# Patient Record
Sex: Male | Born: 1980 | Race: White | Hispanic: No | Marital: Married | Smoking: Former smoker
Health system: Southern US, Community
[De-identification: ages and names within clinical notes are randomized; demographics above are authoritative.]

## PROBLEM LIST (undated history)

## (undated) DIAGNOSIS — I10 Essential (primary) hypertension: Secondary | ICD-10-CM

## (undated) DIAGNOSIS — T7840XA Allergy, unspecified, initial encounter: Secondary | ICD-10-CM

## (undated) HISTORY — DX: Allergy, unspecified, initial encounter: T78.40XA

---

## 2018-01-20 ENCOUNTER — Ambulatory Visit (INDEPENDENT_AMBULATORY_CARE_PROVIDER_SITE_OTHER): Payer: 59 | Admitting: Family Medicine

## 2018-01-20 ENCOUNTER — Encounter: Payer: Self-pay | Admitting: Family Medicine

## 2018-01-20 VITALS — BP 135/87 | HR 69 | Ht 67.0 in | Wt 226.3 lb

## 2018-01-20 DIAGNOSIS — Z809 Family history of malignant neoplasm, unspecified: Secondary | ICD-10-CM

## 2018-01-20 DIAGNOSIS — Z0000 Encounter for general adult medical examination without abnormal findings: Secondary | ICD-10-CM | POA: Diagnosis not present

## 2018-01-20 DIAGNOSIS — Z6835 Body mass index (BMI) 35.0-35.9, adult: Secondary | ICD-10-CM | POA: Insufficient documentation

## 2018-01-20 DIAGNOSIS — Z23 Encounter for immunization: Secondary | ICD-10-CM | POA: Diagnosis not present

## 2018-01-20 DIAGNOSIS — Z87891 Personal history of nicotine dependence: Secondary | ICD-10-CM | POA: Diagnosis not present

## 2018-01-20 NOTE — Patient Instructions (Signed)
If you want to work on weight loss in the future I am here to help facilitate those health goals and go over your lose it app or my fitness pal app to try to obtain a BMI under 30.  Let me know how I can help     Please realize, EXERCISE IS MEDICINE!  -  American Heart Association [facility]) guidelines for exercise : If you are in good health, without any medical conditions, you should engage in 150 minutes of moderate intensity aerobic activity per week.  This means you should be huffing and puffing throughout your workout.   Engaging in regular exercise will improve brain function and memory, as well as improve mood, boost immune system and help with weight management.  As well as the other, more well-known effects of exercise such as decreasing blood sugar levels, decreasing blood pressure,  and decreasing bad cholesterol levels/ increasing good cholesterol levels.     -  The AHA strongly endorses consumption of a diet that contains a variety of foods from all the food categories with an emphasis on fruits and vegetables; fat-free and low-fat dairy products; cereal and grain products; legumes and nuts; and fish, poultry, and/or extra lean meats.    Excessive food intake, especially of foods high in saturated and trans fats, sugar, and salt, should be avoided.    Adequate water intake of roughly 1/2 of your weight in pounds, should equal the ounces of water per day you should drink.  So for instance, if you're 200 pounds, that would be 100 ounces of water per day.         Mediterranean Diet  Why follow it? Research shows. . Those who follow the Mediterranean diet have a reduced risk of heart disease  . The diet is associated with a reduced incidence of Parkinson's and Alzheimer's diseases . People following the diet may have longer life expectancies and lower rates of chronic diseases  . The Dietary Guidelines for Americans recommends the Mediterranean diet as an eating plan to promote health and  prevent disease  What Is the Mediterranean Diet?  . Healthy eating plan based on typical foods and recipes of Mediterranean-style cooking . The diet is primarily a plant based diet; these foods should make up a majority of meals   Starches - Plant based foods should make up a majority of meals - They are an important sources of vitamins, minerals, energy, antioxidants, and fiber - Choose whole grains, foods high in fiber and minimally processed items  - Typical grain sources include wheat, oats, barley, corn, brown rice, bulgar, farro, millet, polenta, couscous  - Various types of beans include chickpeas, lentils, fava beans, black beans, white beans   Fruits  Veggies - Large quantities of antioxidant rich fruits & veggies; 6 or more servings  - Vegetables can be eaten raw or lightly drizzled with oil and cooked  - Vegetables common to the traditional Mediterranean Diet include: artichokes, arugula, beets, broccoli, brussel sprouts, cabbage, carrots, celery, collard greens, cucumbers, eggplant, kale, leeks, lemons, lettuce, mushrooms, okra, onions, peas, peppers, potatoes, pumpkin, radishes, rutabaga, shallots, spinach, sweet potatoes, turnips, zucchini - Fruits common to the Mediterranean Diet include: apples, apricots, avocados, cherries, clementines, dates, figs, grapefruits, grapes, melons, nectarines, oranges, peaches, pears, pomegranates, strawberries, tangerines  Fats - Replace butter and margarine with healthy oils, such as olive oil, canola oil, and tahini  - Limit nuts to no more than a handful a day  - Nuts include walnuts, almonds,  pecans, pistachios, pine nuts  - Limit or avoid candied, honey roasted or heavily salted nuts - Olives are central to the Mediterranean diet - can be eaten whole or used in a variety of dishes   Meats Protein - Limiting red meat: no more than a few times a month - When eating red meat: choose lean cuts and keep the portion to the size of deck of cards -  Eggs: approx. 0 to 4 times a week  - Fish and lean poultry: at least 2 a week  - Healthy protein sources include, chicken, [place], lean beef, lamb - Increase intake of seafood such as tuna, salmon, trout, mackerel, shrimp, scallops - Avoid or limit high fat processed meats such as sausage and bacon  Dairy - Include moderate amounts of low fat dairy products  - Focus on healthy dairy such as fat free yogurt, skim milk, low or reduced fat cheese - Limit dairy products higher in fat such as whole or 2% milk, cheese, ice cream  Alcohol - Moderate amounts of red wine is ok  - No more than 5 oz daily for women (all ages) and men older than age 38  - No more than 10 oz of wine daily for men younger than 5965  Other - Limit sweets and other desserts  - Use herbs and spices instead of salt to flavor foods  - Herbs and spices common to the traditional Mediterranean Diet include: basil, bay leaves, chives, cloves, cumin, fennel, garlic, lavender, marjoram, mint, oregano, parsley, pepper, rosemary, sage, savory, sumac, tarragon, thyme   It's not just a diet, it's a lifestyle:  . The Mediterranean diet includes lifestyle factors typical of those in the region  . Foods, drinks and meals are best eaten with others and savored . Daily physical activity is important for overall good health . This could be strenuous exercise like running and aerobics . This could also be more leisurely activities such as walking, housework, yard-work, or taking the stairs . Moderation is the key; a balanced and healthy diet accommodates most foods and drinks . Consider portion sizes and frequency of consumption of certain foods   Meal Ideas & Options:  . Breakfast:  o Whole wheat toast or whole wheat English muffins with peanut butter & hard boiled egg o Steel cut oats topped with apples & cinnamon and skim milk  o Fresh fruit: banana, strawberries, melon, berries, peaches  o Smoothies: strawberries, bananas, greek yogurt,  peanut butter o Low fat greek yogurt with blueberries and granola  o Egg white omelet with spinach and mushrooms o Breakfast couscous: whole wheat couscous, apricots, skim milk, cranberries  . Sandwiches:  o Hummus and grilled vegetables (peppers, zucchini, squash) on whole wheat bread   o Grilled chicken on whole wheat pita with lettuce, tomatoes, cucumbers or tzatziki  o Tuna salad on whole wheat bread: tuna salad made with greek yogurt, olives, red peppers, capers, green onions o Garlic rosemary lamb pita: lamb sauted with garlic, rosemary, salt & pepper; add lettuce, cucumber, greek yogurt to pita - flavor with lemon juice and black pepper  . Seafood:  o Mediterranean grilled salmon, seasoned with garlic, basil, parsley, lemon juice and black pepper o Shrimp, lemon, and spinach whole-grain pasta salad made with low fat greek yogurt  o Seared scallops with lemon orzo  o Seared tuna steaks seasoned salt, pepper, coriander topped with tomato mixture of olives, tomatoes, olive oil, minced garlic, parsley, green onions and cappers  . Meats:  o Herbed greek chicken salad with kalamata olives, cucumber, feta  o Red bell peppers stuffed with spinach, bulgur, lean ground beef (or lentils) & topped with feta   o Kebabs: skewers of chicken, tomatoes, onions, zucchini, squash  o [place] burgers: made with red onions, mint, dill, lemon juice, feta cheese topped with roasted red peppers . Vegetarian o Cucumber salad: cucumbers, artichoke hearts, celery, red onion, feta cheese, tossed in olive oil & lemon juice  o Hummus and whole grain pita points with a greek salad (lettuce, tomato, feta, olives, cucumbers, red onion) o Lentil soup with celery, carrots made with vegetable broth, garlic, salt and pepper  o Tabouli salad: parsley, bulgur, mint, scallions, cucumbers, tomato, radishes, lemon juice, olive oil, salt and pepper.

## 2018-01-20 NOTE — Addendum Note (Signed)
Addended by: Leda MinPULLIAM, MELISSA D on: 01/20/2018 05:29 PM   Modules accepted: Orders

## 2018-01-20 NOTE — Progress Notes (Signed)
New patient office visit note: Impression and Recommendations:    1. Healthcare maintenance   2. History of social smoking for 19 years- 1 cig q 3-d   3. BMI 35.0-35.9,adult   4. Family history of cancer     Education and routine counseling performed. Handouts provided.  1. General Health & Weight Maintenance - Advised patient to continue working toward exercising to improve health.    - Patient may begin with 15 minutes of activity daily.  Recommended that the patient eventually strive for at least 150 minutes of cardio per week according to the [facility].   - American Heart Association guidelines for healthy diet, basically Mediterranean diet, and exercise guidelines of moderate exercise 30 minutes 5 days per week or more discussed in detail.  - Healthy dietary habits encouraged, including low-carb, and high amounts of lean protein in diet.   - Patient should also consume adequate amounts of water - half of body weight in oz of water per day  - Recommended tracking food intake seriously if interested in losing weight.    - Advised the patient that reducing his BMI from 35.44 kg/m. down to the 30-35 range will decrease his risk for disease. - Discussed that 190 may be realistic for him, and getting BMI below 30 is a good goal.  Explained to patient what BMI refers to, and what it means medically.    Told patient to think about it as a "medical risk stratification measurement" and how increasing BMI is associated with increasing risk/ or worsening state of various diseases such as hypertension, hyperlipidemia, diabetes, premature OA, depression etc.  Health counseling performed.  All questions answered.  - Discussed that we can meet with him regularly in the future to facilitate his weight loss goals.  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  2. Follow-Up - Patient agrees to return for his yearly physical & disease  screen, and for a yearly chronic health visit on topics that concern him.  - Fasting blood work will be obtained in the near future at his convenience. - Follow up OV should be scheduled to discuss his blood work.  - CPE in 4-6 months.   Gross side effects, risk and benefits, and alternatives of medications discussed with patient.  Patient is aware that all medications have potential side effects and we are unable to predict every side effect or drug-drug interaction that may occur.  Expresses verbal understanding and consents to current therapy plan and treatment regimen.  Return for FBW near future with f/up OV to discuss; then yrly P Exam.  Please see AVS handed out to patient at the end of our visit for further patient instructions/ counseling done pertaining to today's office visit.    Note: This document was prepared using Dragon voice recognition software and may include unintentional dictation errors.  This document serves as a record of services personally performed by Thomasene Lot, DO. It was created on her behalf by Peggye Fothergill, a trained medical scribe. The creation of this record is based on the scribe's personal observations and the provider's statements to them.   I have reviewed the above medical documentation for accuracy and completeness and I concur.  Thomasene Lot 01/20/18 5:19 PM    ----------------------------------------------------------------------------------------------------------------------    Subjective:    Chief complaint:   Chief Complaint  Patient presents with  . Establish Care    HPI: Henry Miranda is  a pleasant 37 y.o. male who [facility] today to review their medical history with me and establish care.   I asked the patient to review their chronic problem list with me to ensure everything was updated and accurate.    All recent office visits with other providers, any medical records  that patient brought in etc  - I reviewed today.     We asked pt to get us their medical records from [facility] providers/ specialists that they had seen within the past 3-5 years- if they are in private practice and/or do not work for [facility], [facility], [facility], [facility] or [facility] owned practice.  Told them to call their specialists to clarify this if they are not sure.   Here to establish at the clinic along with the rest of his family.  Social History Wife Misty; 3 kids.  All seen here at clinic. 2 daughters, aged 248 and 679.  1 son aged 38.  [place] here this past summer for work.  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 [place] - travels a [place]. 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 Likes the company he works for, [employer].  One of four siblings Youngest of three brothers, and one little sister who lives in [place].  Dad passed when he was 37 years old.  - Tobacco Use Smoked socially for 19 years, off and on. "Was not habitual."  Started at age 89, quit ~20 years later. On average, smoked about one cigarette every 2-3 days for 19 years.  Eats fruits and vegetables, a lot of nuts. Feels that he eats "fairly well."  Was an athlete when he was younger. Patient notes that he weighed 155 as a sophomore in high school. Prefers to weigh about 175 lbs, feels that 190 lbs is a realistic goal.  Used to ride a bicycle to work for 3 years, and still couldn't lose the weight.  Uses MyFitnessPal app and also has LoseIt app.  Family History Father had brain tumor - some kind of brain cancer. Father was age 38 at death.  Grandparents (father's parents) smoked 2 ppd each.  Mom had high blood pressure. Great uncle died of pancreatic cancer in his  late 4650's. Great great grandmother died of cancer, "ate her alive literally."  Notes that most of his family is very thin.  Grandmother has sclera derma and has been on chemo for 10 years.  Past Medical History No history of high blood pressure. No cholesterol problems. Never been told he was prediabetic. Never has had allergies.  Last blood work was drawn when he got married.  Chronic muscle/elbow pain from holding his computer in the same position. Notes that he's stuck in a car half the time, holding his laptop constantly.  Notes "I don't go to the doctor much."   Wt Readings from Last 3 Encounters:  01/20/18 226 lb 4.8 oz (102.6 kg)   BP Readings from Last 3 Encounters:  01/20/18 (!) 145/90   Pulse Readings from Last 3 Encounters:  01/20/18 69   BMI Readings from Last 3 Encounters:  01/20/18 35.44 kg/m    Patient Care Team    Relationship Specialty Notifications Start End  Thomasene Lotpalski, Marv Alfrey, DO PCP - General Family Medicine  12/09/17  Patient Active Problem List   Diagnosis Date Noted  . History of social smoking for 19 years- 1 cig q 3-d 01/20/2018  . BMI 35.0-35.9,adult 01/20/2018    Family History  Problem Relation Age of Onset  . Hypertension Mother   . Cancer Father        brain     Social History   Substance and Sexual Activity  Drug Use No     Social History   Substance and Sexual Activity  Alcohol Use Yes  . Alcohol/week: 1.2 - 1.8 oz  . Types: 2 - 3 Standard drinks or equivalent per week     Social History   Tobacco Use  Smoking Status Former Smoker  . Packs/day: 0.50  . Years: 10.00  . Pack years: 5.00  . Types: Cigarettes  . Last attempt to quit: 2000  . Years since quitting: 19.1  Smokeless Tobacco Never Used     No outpatient medications have been marked as taking for the 01/20/18 encounter (Office Visit) with Thomasene Lot, DO.    Allergies: Patient has no allergy information on record.   Review of Systems    Constitutional: Negative for chills, diaphoresis, fever, malaise/fatigue and weight loss.  HENT: Negative for congestion, sore throat and tinnitus.   Eyes: Negative for blurred vision, double vision and photophobia.  Respiratory: Negative for cough and wheezing.   Cardiovascular: Negative for chest pain and palpitations.  Gastrointestinal: Negative for blood in stool, diarrhea, nausea and vomiting.  Genitourinary: Negative for dysuria, frequency and urgency.  Musculoskeletal: Negative for joint pain and myalgias.  Skin: Negative for itching and rash.  Neurological: Negative for dizziness, focal weakness, weakness and headaches.  Endo/Heme/Allergies: Negative for environmental allergies and polydipsia. Does not bruise/bleed easily.  Psychiatric/Behavioral: Negative for depression and memory loss. The patient is not nervous/anxious and does not have insomnia.      Objective:   Blood pressure (!) 145/90, pulse 69, height 5\' 7"  (1.702 m), weight 226 lb 4.8 oz (102.6 kg), SpO2 99 %. Body mass index is 35.44 kg/m. General: Well Developed, well nourished, and in no acute distress.  Neuro: Alert and oriented x3, extra-ocular muscles intact, sensation grossly intact.  HEENT:[place]/AT, PERRLA, neck supple, No carotid bruits Skin: no gross rashes  Cardiac: Regular rate and rhythm Respiratory: Essentially clear to auscultation bilaterally. Not using accessory muscles, speaking in full sentences.  Abdominal: not grossly distended Musculoskeletal: Ambulates w/o diff, FROM * 4 ext.  Vasc: less 2 sec cap RF, warm and pink  Psych:  No HI/SI, judgement and insight good, Euthymic mood. Full Affect.

## 2019-07-13 ENCOUNTER — Ambulatory Visit (INDEPENDENT_AMBULATORY_CARE_PROVIDER_SITE_OTHER): Payer: 59 | Admitting: Family Medicine

## 2019-07-13 ENCOUNTER — Other Ambulatory Visit: Payer: Self-pay

## 2019-07-13 ENCOUNTER — Encounter: Payer: Self-pay | Admitting: Family Medicine

## 2019-07-13 ENCOUNTER — Other Ambulatory Visit: Payer: 59

## 2019-07-13 VITALS — BP 137/90 | HR 62 | Ht 66.5 in | Wt 190.0 lb

## 2019-07-13 DIAGNOSIS — Z23 Encounter for immunization: Secondary | ICD-10-CM | POA: Diagnosis not present

## 2019-07-13 DIAGNOSIS — L814 Other melanin hyperpigmentation: Secondary | ICD-10-CM | POA: Insufficient documentation

## 2019-07-13 DIAGNOSIS — Z0000 Encounter for general adult medical examination without abnormal findings: Secondary | ICD-10-CM

## 2019-07-13 DIAGNOSIS — B354 Tinea corporis: Secondary | ICD-10-CM | POA: Insufficient documentation

## 2019-07-13 DIAGNOSIS — Z719 Counseling, unspecified: Secondary | ICD-10-CM | POA: Diagnosis not present

## 2019-07-13 DIAGNOSIS — L819 Disorder of pigmentation, unspecified: Secondary | ICD-10-CM

## 2019-07-13 DIAGNOSIS — N509 Disorder of male genital organs, unspecified: Secondary | ICD-10-CM

## 2019-07-13 NOTE — Progress Notes (Signed)
Male physical  Impression and Recommendations:    1. Encounter for wellness examination   2. Health education/counseling   3. Need for Tdap vaccination   4. Ringworm of body   5. 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 Nevoid hypermelanosis     1) Anticipatory Guidance: Discussed importance of wearing a seatbelt while driving, not texting while driving;   sunscreen when outside along with skin surveillance; eating a balanced and modest diet; physical activity at least 25 minutes per day or 150 min/ week moderate to intense activity.  - Patient knows to keep an eye on his blood pressure at home.  - Prudent self-testicular exam screening habits reviewed with patient today.  Ringworm of Body - To help prevent fungal skin concerns, advised patient to remove damp clothing ASAP. - Will continue to monitor.  Nevoid hypermelanosis - Prudent skin screening habits reviewed and discussed with patient today. - Patient knows to keep an eye on skin when concerning sx appear.   2) Immunizations / Screenings / Labs:  All immunizations are up-to-date per recommendations or will be updated today. Patient is due for dental and vision screens which pt will schedule independently. Will obtain CBC, CMP, HgA1c, Lipid panel, TSH and vit D when fasting, if not already done recently.   - Blood work obtained this morning.  - Need for TDAP.  Education provided to patient today.  - Discussed need for once-lifetime Hep C / HIV screen.  - Patient understands need for screenings as recommended.   3) Weight - Body mass index is 30.21 kg/m BMI meaning discussed with patient.  Discussed goal of losing 5-10% of current body weight which would improve overall feelings of well being and improve objective health data. Improve nutrient density of diet through increasing intake of fruits and vegetables and decreasing saturated fats, white flour products and refined sugars.    Told patient to think about BMI as a "medical risk stratification measurement" and how increasing BMI is associated with increasing risk/ or worsening state of various diseases such as hypertension, hyperlipidemia, diabetes, premature OA, depression etc.  American Heart Association guidelines for healthy diet, basically Mediterranean diet, and exercise guidelines of 30 minutes 5 days per week or more discussed in detail.  - Encouraged patient to continue with his weight loss goals. - Advised patient of importance of reducing BMI below the 30 range.  Health counseling performed.  All questions answered.   4) Lifestyle & Preventative Health Maintenance - Advised patient to continue working toward exercising and physical conditioning to improve overall mental, physical, and emotional health.    - Reviewed the "spokes of the wheel" of mood and health management.  Stressed the importance of ongoing prudent habits, including regular exercise, appropriate sleep hygiene, healthful dietary habits, and prayer/meditation to relax.  - Encouraged patient to engage in daily physical activity, especially a formal exercise routine.  Recommended that the patient eventually strive for at least 150 minutes of moderate cardiovascular activity per week according to guidelines established by the [facility].   - Healthy dietary habits encouraged, including low-carb, and high amounts of lean protein in diet.   - Patient should also consume adequate amounts of water.   Orders Placed This Encounter  Procedures   Tdap vaccine greater than or equal to 7yo IM    Gross side effects, risk and benefits, and alternatives of medications discussed with patient.  Patient is aware that all medications have  potential side effects and we are unable to predict every side effect or drug-drug interaction that may occur.  Expresses verbal understanding and consents to current therapy plan and treatment regimen.  Please see AVS  handed out to patient at the end of our visit for further patient instructions/ counseling done pertaining to today's office visit.  Follow-up preventative CPE in 1 year. Follow-up office visit pending lab work.  F/up sooner for chronic care management and/or prn  This document serves as a record of services personally performed by Thomasene Loteborah Kartel Wolbert, DO. It was created on her behalf by Peggye FothergillKatherine Galloway, a trained medical scribe. The creation of this record is based on the scribe's personal observations and the provider's statements to them.   I have reviewed the above medical documentation for accuracy and completeness and I concur.  Thomasene Loteborah Kelvin Burpee, DO 07/17/2019 11:55 PM        Subjective:    CC: CPE  HPI: Ceasar LundJeffrey Stonebraker is a 38 y.o. male who [facility] yearly health maintenance exam.     Health Maintenance Summary Reviewed and updated, unless pt declines services.  Tobacco History Reviewed:  Former smoker. Per patient, smoked socially 19 years, 1 cigarette every 3 days; ~5 pack-years. Alcohol:  Has one or two drinks per week, typically a beer or a whisky.   States "it depends what I have at the house." Exercise Habits:  States he started playing golf and baseball about a month ago. Walks three days per week, plays golf for 1.5-3 hours at a time. Also goes to the driving range and walks his dog for 45 min "most days." STD concerns:  Monogamous with wife, no concerns. Drug Use:  None Birth control method:  None reported. Testicular/penile concerns:  Denies concerns; "everything functions the way it's supposed to."  Patient has had the mass on his left testicle for as long as he can recall, and has always been told he has this little abnormality at the base of his testicle.  Denies depression or anxiety.  Denies GI concerns such as vomiting, diarrhea, constipation, loose stools.  States family history of pancreatic cancer,  2nd degree relative with skin cancer, 2nd or 3rd degree relative with a cancer that "ate them from the inside out." His great uncle Elijah Birkom had pancreatic cancer.  No known history of prostate cancer.  Denies urinary concerns such as problems with stream, getting up frequently at night.  Recent Weight Loss - 36 lbs Weight was 226 lbs on last visit, which was 01/20/2018. Patient is now down to 190 lbs.  [facility]. States he got up to 234 lbs and decided to begin [employer]. His goal is 175 lbs on Weight Watchers. Realistically, he would like to try to get to 155 lbs.  Visual Health Obtains eye exams yearly.  Dental Health Returns to the dentist every six months. Isn't sure what his dentist's name is.  Dermatological Health Confirms wearing sunscreen. Had a recent run-in with ringworm and has been using "stuff he had at home" for this.      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 No pertinent past medical history.  History reviewed. No pertinent surgical history.  Family History  Problem Relation Age of Onset    Hypertension Mother    Cancer Father        brain    Social History   Substance and Sexual Activity  Drug Use No  ,  Social History   Substance and Sexual Activity  Alcohol Use Yes   Alcohol/week: 2.0 - 3.0 standard drinks   Types: 2 - 3 Standard drinks or equivalent per week  ,  Social History   Tobacco Use  Smoking Status Former Smoker   Packs/day: 0.50   Years: 10.00   Pack years: 5.00   Types: Cigarettes   Quit date: 2000   Years since quitting: 20.6  Smokeless Tobacco Never Used  ,  Social History   Substance and Sexual Activity  Sexual Activity Yes   Birth control/protection: None    Patient's Medications  New Prescriptions   PREDNISONE (STERAPRED UNI-PAK 21 TAB) 10 MG (21) TBPK TABLET    6 tabs for 1 day, then 5 tabs for 1 das, then 4 tabs for 1 day, then 3 tabs for 1 day, 2 tabs for 1 day, then 1 tab for 1 day  Previous Medications   No medications on file  Modified Medications   No medications on file  Discontinued Medications   No medications on file    Patient has no known allergies.  Review of Systems: General:   Denies fever, chills, unexplained weight loss.  Optho/Auditory:   Denies visual changes, blurred vision/LOV Respiratory:   Denies SOB, DOE more than baseline levels.  Cardiovascular:   Denies chest pain, palpitations, new onset peripheral edema  Gastrointestinal:   Denies nausea, vomiting, diarrhea.  Genitourinary: Denies dysuria, freq/ urgency, flank pain or discharge from genitals.  Endocrine:     Denies hot or cold intolerance, polyuria, polydipsia. Musculoskeletal:   Denies unexplained myalgias, joint swelling, unexplained arthralgias, gait problems.  Skin:  Denies rash, suspicious lesions Neurological:     Denies dizziness, unexplained weakness, numbness  Psychiatric/Behavioral:   Denies mood changes, suicidal or homicidal ideations, hallucinations    Objective:     Blood pressure 137/90, pulse 62, height 5' 6.5"  (1.689 m), weight 190 lb (86.2 kg), SpO2 98 %. Body mass index is 30.21 kg/m. General Appearance:    Alert, cooperative, no distress, appears stated age  Head:    Normocephalic, without obvious abnormality, atraumatic  Eyes:    PERRL, conjunctiva/corneas clear, EOM's intact, fundi    benign, both eyes  Ears:    Normal TM's and external ear canals, both ears  Nose:   Nares normal, septum midline, mucosa normal, no drainage    or sinus tenderness  Throat:   Lips w/o lesion, mucosa moist, and tongue normal; teeth and   gums normal  Neck:   Supple, symmetrical,  trachea midline, no adenopathy;    thyroid:  no enlargement/tenderness/nodules; no carotid   bruit or JVD  Back:     Symmetric, no curvature, ROM normal, no CVA tenderness  Lungs:     Clear to auscultation bilaterally, respirations unlabored, no       Wh/ R/ R  Chest Wall:    No tenderness or gross deformity; normal excursion   Heart:    Regular rate and rhythm, S1 and S2 normal, no murmur, rub   or gallop  Abdomen:     Soft, non-tender, bowel sounds active all four quadrants, NO   G/R/R, no masses, no organomegaly  Genitalia:    Ext genitalia: without lesion, no penile rash or discharge, no hernias appreciated.  Base of left testicle with 1.5-2 cm soft tissue mass at base of teste that is nontender and freely mobile ( per pt it has been there forever).  Rectal:    Deferred to age 38.  Extremities:   Extremities normal, atraumatic, no cyanosis or gross edema  Pulses:   2+ and symmetric all extremities  Skin:   Warm, dry, Skin color, texture, turgor normal.  Two hypo melanotic skin patches consistent with ringworm on abdomen.  Also, 2 mm by 1 mm hypermelanotic skin lesion upper abdomen, 2 inches above umbilicus.  M-Sk:   Ambulates * 4 w/o difficulty, no gross deformities, tone WNL  Neurologic:   CNII-XII intact, normal strength, sensation and reflexes    Throughout Psych:  No HI/SI, judgement and insight good, Euthymic mood. Full  Affect.

## 2019-07-13 NOTE — Patient Instructions (Signed)
Preventive Care for Adults, Male A healthy lifestyle and preventive care can promote health and wellness. Preventive health guidelines for men include the following key practices:  A routine yearly physical is a good way to check with your health care provider about your health and preventative screening. It is a chance to share any concerns and updates on your health and to receive a thorough exam.  Visit your dentist for a routine exam and preventative care every 6 months. Brush your teeth twice a day and floss once a day. Good oral hygiene prevents tooth decay and gum disease.  The frequency of eye exams is based on your age, health, family medical history, use of contact lenses, and other factors. Follow your health care provider's recommendations for frequency of eye exams.  Eat a healthy diet. Foods such as vegetables, fruits, whole grains, low-fat dairy products, and lean protein foods contain the nutrients you need without too many calories. Decrease your intake of foods high in solid fats, added sugars, and salt. Eat the right amount of calories for you.Get information about a proper diet from your health care provider, if necessary.  Regular physical exercise is one of the most important things you can do for your health. Most adults should get at least 150 minutes of moderate-intensity exercise (any activity that increases your heart rate and causes you to sweat) each week. In addition, most adults need muscle-strengthening exercises on 2 or more days a week.  Maintain a healthy weight. The body mass index (BMI) is a screening tool to identify possible weight problems. It provides an estimate of body fat based on height and weight. Your health care provider can find your BMI and can help you achieve or maintain a healthy weight.For adults 20 years and older:  A BMI below 18.5 is considered underweight.  A BMI of 18.5 to 24.9 is normal.  A BMI of 25 to 29.9 is considered  overweight.  A BMI of 30 and above is considered obese.  Maintain normal blood lipids and cholesterol levels by exercising and minimizing your intake of saturated fat. Eat a balanced diet with plenty of fruit and vegetables. Blood tests for lipids and cholesterol should begin at age 20 and be repeated every 5 years. If your lipid or cholesterol levels are high, you are over 50, or you are at high risk for heart disease, you may need your cholesterol levels checked more frequently.Ongoing high lipid and cholesterol levels should be treated with medicines if diet and exercise are not working.  If you smoke, find out from your health care provider how to quit. If you do not use tobacco, do not start.  Lung cancer screening is recommended for adults aged 55-80 years who are at high risk for developing lung cancer because of a history of smoking. A yearly low-dose CT scan of the lungs is recommended for people who have at least a 30-pack-year history of smoking and are a current smoker or have quit within the past 15 years. A pack year of smoking is smoking an average of 1 pack of cigarettes a day for 1 year (for example: 1 pack a day for 30 years or 2 packs a day for 15 years). Yearly screening should continue until the smoker has stopped smoking for at least 15 years. Yearly screening should be stopped for people who develop a health problem that would prevent them from having lung cancer treatment.  If you choose to drink alcohol, do not have more   than 2 drinks per day. One drink is considered to be 12 ounces (355 mL) of beer, 5 ounces (148 mL) of wine, or 1.5 ounces (44 mL) of liquor.  Avoid use of street drugs. Do not share needles with anyone. Ask for help if you need support or instructions about stopping the use of drugs.  High blood pressure causes heart disease and increases the risk of stroke. Your blood pressure should be checked at least every 1-2 years. Ongoing high blood pressure should be  treated with medicines, if weight loss and exercise are not effective.  If you are 45-79 years old, ask your health care provider if you should take aspirin to prevent heart disease.  Diabetes screening is done by taking a blood sample to check your blood glucose level after you have not eaten for a certain period of time (fasting). If you are not overweight and you do not have risk factors for diabetes, you should be screened once every 3 years starting at age 45. If you are overweight or obese and you are 40-70 years of age, you should be screened for diabetes every year as part of your cardiovascular risk assessment.  Colorectal cancer can be detected and often prevented. Most routine colorectal cancer screening begins at the age of 50 and continues through age 75. However, your health care provider may recommend screening at an earlier age if you have risk factors for colon cancer. On a yearly basis, your health care provider may provide home test kits to check for hidden blood in the stool. Use of a small camera at the end of a tube to directly examine the colon (sigmoidoscopy or colonoscopy) can detect the earliest forms of colorectal cancer. Talk to your health care provider about this at age 50, when routine screening begins. Direct exam of the colon should be repeated every 5-10 years through age 75, unless early forms of precancerous polyps or small growths are found.  People who are at an increased risk for hepatitis B should be screened for this virus. You are considered at high risk for hepatitis B if:  You were born in a country where hepatitis B occurs often. Talk with your health care provider about which countries are considered high risk.  Your parents were born in a high-risk country and you have not received a shot to protect against hepatitis B (hepatitis B vaccine).  You have HIV or AIDS.  You use needles to inject street drugs.  You live with, or have sex with, someone who  has hepatitis B.  You are a man who has sex with other men (MSM).  You get hemodialysis treatment.  You take certain medicines for conditions such as cancer, organ transplantation, and autoimmune conditions.  Hepatitis C blood testing is recommended for all people born from 1945 through 1965 and any individual with known risks for hepatitis C.  Practice safe sex. Use condoms and avoid high-risk sexual practices to reduce the spread of sexually transmitted infections (STIs). STIs include gonorrhea, chlamydia, syphilis, trichomonas, herpes, HPV, and human immunodeficiency virus (HIV). Herpes, HIV, and HPV are viral illnesses that have no cure. They can result in disability, cancer, and death.  If you are a man who has sex with other men, you should be screened at least once per year for:  HIV.  Urethral, rectal, and pharyngeal infection of gonorrhea, chlamydia, or both.  If you are at risk of being infected with HIV, it is recommended that you take a   that you take a prescription medicine daily to prevent HIV infection. This is called preexposure prophylaxis (PrEP). You are considered at risk if:  You are a man who has sex with other men (MSM) and have other risk factors.  You are a heterosexual man, are sexually active, and are at increased risk for HIV infection.  You take drugs by injection.  You are sexually active with a partner who has HIV.  Talk with your health care provider about whether you are at high risk of being infected with HIV. If you choose to begin PrEP, you should first be tested for HIV. You should then be tested every 3 months for as long as you are taking PrEP.  A one-time screening for abdominal aortic aneurysm (AAA) and surgical repair of large AAAs by ultrasound are recommended for men ages 24 to 16 years who are current or former smokers.  Healthy men should no longer receive prostate-specific antigen (PSA) blood tests as part of routine cancer screening. Talk with your health  care provider about prostate cancer screening.  Testicular cancer screening is not recommended for adult males who have no symptoms. Screening includes self-exam, a health care provider exam, and other screening tests. Consult with your health care provider about any symptoms you have or any concerns you have about testicular cancer.  Use sunscreen. Apply sunscreen liberally and repeatedly throughout the day. You should seek shade when your shadow is shorter than you. Protect yourself by wearing long sleeves, pants, a wide-brimmed hat, and sunglasses year round, whenever you are outdoors.  Once a month, do a whole-body skin exam, using a mirror to look at the skin on your back. Tell your health care provider about new moles, moles that have irregular borders, moles that are larger than a pencil eraser, or moles that have changed in shape or color.  Stay current with required vaccines (immunizations).  Influenza vaccine. All adults should be immunized every year.  Tetanus, diphtheria, and acellular pertussis (Td, Tdap) vaccine. An adult who has not previously received Tdap or who does not know his vaccine status should receive 1 dose of Tdap. This initial dose should be followed by tetanus and diphtheria toxoids (Td) booster doses every 10 years. Adults with an unknown or incomplete history of completing a 3-dose immunization series with Td-containing vaccines should begin or complete a primary immunization series including a Tdap dose. Adults should receive a Td booster every 10 years.  Varicella vaccine. An adult without evidence of immunity to varicella should receive 2 doses or a second dose if he has previously received 1 dose.  Human papillomavirus (HPV) vaccine. Males aged 11-21 years who have not received the vaccine previously should receive the 3-dose series. Males aged 22-26 years may be immunized. Immunization is recommended through the age of 20 years for any male who has sex with males  and did not get any or all doses earlier. Immunization is recommended for any person with an immunocompromised condition through the age of 23 years if he did not get any or all doses earlier. During the 3-dose series, the second dose should be obtained 4-8 weeks after the first dose. The third dose should be obtained 24 weeks after the first dose and 16 weeks after the second dose.  Zoster vaccine. One dose is recommended for adults aged 74 years or older unless certain conditions are present.  Measles, mumps, and rubella (MMR) vaccine. Adults born before 80 generally are considered immune to measles and mumps.  or later should have 1 or more doses of MMR vaccine unless there is a contraindication to the vaccine or there is laboratory evidence of immunity to each of the three diseases. A routine second dose of MMR vaccine should be obtained at least 28 days after the first dose for students attending postsecondary schools, health care workers, or international travelers. People who received inactivated measles vaccine or an unknown type of measles vaccine during 1963-1967 should receive 2 doses of MMR vaccine. People who received inactivated mumps vaccine or an unknown type of mumps vaccine before 1979 and are at high risk for mumps infection should consider immunization with 2 doses of MMR vaccine. Unvaccinated health care workers born before 1957 who lack laboratory evidence of measles, mumps, or rubella immunity or laboratory confirmation of disease should consider measles and mumps immunization with 2 doses of MMR vaccine or rubella immunization with 1 dose of MMR vaccine.  Pneumococcal 13-valent conjugate (PCV13) vaccine. When indicated, a person who is uncertain of his immunization history and has no record of immunization should receive the PCV13 vaccine. All adults 65 years of age and older should receive this vaccine. An adult aged 19 years or older who has certain medical  conditions and has not been previously immunized should receive 1 dose of PCV13 vaccine. This PCV13 should be followed with a dose of pneumococcal polysaccharide (PPSV23) vaccine. Adults who are at high risk for pneumococcal disease should obtain the PPSV23 vaccine at least 8 weeks after the dose of PCV13 vaccine. Adults older than 39 years of age who have normal immune system function should obtain the PPSV23 vaccine dose at least 1 year after the dose of PCV13 vaccine.  Pneumococcal polysaccharide (PPSV23) vaccine. When PCV13 is also indicated, PCV13 should be obtained first. All adults aged 65 years and older should be immunized. An adult younger than age 65 years who has certain medical conditions should be immunized. Any person who resides in a nursing home or long-term care facility should be immunized. An adult smoker should be immunized. People with an immunocompromised condition and certain other conditions should receive both PCV13 and PPSV23 vaccines. People with human immunodeficiency virus (HIV) infection should be immunized as soon as possible after diagnosis. Immunization during chemotherapy or radiation therapy should be avoided. Routine use of PPSV23 vaccine is not recommended for American Indians, [place] Natives, or people younger than 65 years unless there are medical conditions that require PPSV23 vaccine. When indicated, people who have unknown immunization and have no record of immunization should receive PPSV23 vaccine. One-time revaccination 5 years after the first dose of PPSV23 is recommended for people aged 19-64 years who have chronic kidney failure, nephrotic syndrome, asplenia, or immunocompromised conditions. People who received 1-2 doses of PPSV23 before age 65 years should receive another dose of PPSV23 vaccine at age 65 years or later if at least 5 years have passed since the previous dose. Doses of PPSV23 are not needed for people immunized with PPSV23 at or after age 65  years.  Meningococcal vaccine. Adults with asplenia or persistent complement component deficiencies should receive 2 doses of quadrivalent meningococcal conjugate (MenACWY-D) vaccine. The doses should be obtained at least 2 months apart. Microbiologists working with certain meningococcal bacteria, military recruits, people at risk during an outbreak, and people who travel to or live in countries with a high rate of meningitis should be immunized. A first-year college student up through age 21 years who is living in a residence hall should receive a   hall should receive a dose if he did not receive a dose on or after his 16th birthday. Adults who have certain high-risk conditions should receive one or more doses of vaccine.  Hepatitis A vaccine. Adults who wish to be protected from this disease, have chronic liver disease, work with hepatitis A-infected animals, work in hepatitis A research labs, or travel to or work in countries with a high rate of hepatitis A should be immunized. Adults who were previously unvaccinated and who anticipate close contact with an international adoptee during the first 60 days after arrival in the [place] from a country with a high rate of hepatitis A should be immunized.  Hepatitis B vaccine. Adults should be immunized if they wish to be protected from this disease, are under age 16 years and have diabetes, have chronic liver disease, have had more than one sex partner in the past 6 months, may be exposed to blood or other infectious body fluids, are household contacts or sex partners of hepatitis B positive people, are clients or workers in certain care facilities, or travel to or work in countries with a high rate of hepatitis B.  Haemophilus influenzae type b (Hib) vaccine. A previously unvaccinated person with asplenia or sickle cell disease or having a scheduled splenectomy should receive 1 dose of Hib vaccine. Regardless of previous immunization, a recipient of a hematopoietic stem cell  transplant should receive a 3-dose series 6-12 months after his successful transplant. Hib vaccine is not recommended for adults with HIV infection. Preventive Service / Frequency Ages 21 to 27  Blood pressure check.** / Every 3-5 years.  Lipid and cholesterol check.** / Every 5 years beginning at age 57.  Hepatitis C blood test.** / For any individual with known risks for hepatitis C.  Skin self-exam. / Monthly.  Influenza vaccine. / Every year.  Tetanus, diphtheria, and acellular pertussis (Tdap, Td) vaccine.** / Consult your health care provider. 1 dose of Td every 10 years.  Varicella vaccine.** / Consult your health care provider.  HPV vaccine. / 3 doses over 6 months, if 34 or younger.  Measles, mumps, rubella (MMR) vaccine.** / You need at least 1 dose of MMR if you were born in 1957 or later. You may also need a second dose.  Pneumococcal 13-valent conjugate (PCV13) vaccine.** / Consult your health care provider.  Pneumococcal polysaccharide (PPSV23) vaccine.** / 1 to 2 doses if you smoke cigarettes or if you have certain conditions.  Meningococcal vaccine.** / 1 dose if you are age 36 to 56 years and a Market researcher living in a residence hall, or have one of several medical conditions. You may also need additional booster doses.  Hepatitis A vaccine.** / Consult your health care provider.  Hepatitis B vaccine.** / Consult your health care provider.  Haemophilus influenzae type b (Hib) vaccine.** / Consult your health care provider. Ages 70 to 14  Blood pressure check.** / Every year.  Lipid and cholesterol check.** / Every 5 years beginning at age 60.  Lung cancer screening. / Every year if you are aged 12-80 years and have a 30-pack-year history of smoking and currently smoke or have quit within the past 15 years. Yearly screening is stopped once you have quit smoking for at least 15 years or develop a health problem that would prevent you from having  lung cancer treatment.  Fecal occult blood test (FOBT) of stool. / Every year beginning at age 37 and continuing until age 81. You may  this test if you get a colonoscopy every 10 years.  Flexible sigmoidoscopy** or colonoscopy.** / Every 5 years for a flexible sigmoidoscopy or every 10 years for a colonoscopy beginning at age 50 and continuing until age 75.  Hepatitis C blood test.** / For all people born from 1945 through 1965 and any individual with known risks for hepatitis C.  Skin self-exam. / Monthly.  Influenza vaccine. / Every year.  Tetanus, diphtheria, and acellular pertussis (Tdap/Td) vaccine.** / Consult your health care provider. 1 dose of Td every 10 years.  Varicella vaccine.** / Consult your health care provider.  Zoster vaccine.** / 1 dose for adults aged 60 years or older.  Measles, mumps, rubella (MMR) vaccine.** / You need at least 1 dose of MMR if you were born in 1957 or later. You may also need a second dose.  Pneumococcal 13-valent conjugate (PCV13) vaccine.** / Consult your health care provider.  Pneumococcal polysaccharide (PPSV23) vaccine.** / 1 to 2 doses if you smoke cigarettes or if you have certain conditions.  Meningococcal vaccine.** / Consult your health care provider.  Hepatitis A vaccine.** / Consult your health care provider.  Hepatitis B vaccine.** / Consult your health care provider.  Haemophilus influenzae type b (Hib) vaccine.** / Consult your health care provider. Ages 65 and over  Blood pressure check.** / Every year.  Lipid and cholesterol check.**/ Every 5 years beginning at age 20.  Lung cancer screening. / Every year if you are aged 55-80 years and have a 30-pack-year history of smoking and currently smoke or have quit within the past 15 years. Yearly screening is stopped once you have quit smoking for at least 15 years or develop a health problem that would prevent you from having lung cancer treatment.  Fecal  occult blood test (FOBT) of stool. / Every year beginning at age 50 and continuing until age 75. You may not have to do this test if you get a colonoscopy every 10 years.  Flexible sigmoidoscopy** or colonoscopy.** / Every 5 years for a flexible sigmoidoscopy or every 10 years for a colonoscopy beginning at age 50 and continuing until age 75.  Hepatitis C blood test.** / For all people born from 1945 through 1965 and any individual with known risks for hepatitis C.  Abdominal aortic aneurysm (AAA) screening.** / A one-time screening for ages 65 to 75 years who are current or former smokers.  Skin self-exam. / Monthly.  Influenza vaccine. / Every year.  Tetanus, diphtheria, and acellular pertussis (Tdap/Td) vaccine.** / 1 dose of Td every 10 years.  Varicella vaccine.** / Consult your health care provider.  Zoster vaccine.** / 1 dose for adults aged 60 years or older.  Pneumococcal 13-valent conjugate (PCV13) vaccine.** / 1 dose for all adults aged 65 years and older.  Pneumococcal polysaccharide (PPSV23) vaccine.** / 1 dose for all adults aged 65 years and older.  Meningococcal vaccine.** / Consult your health care provider.  Hepatitis A vaccine.** / Consult your health care provider.  Hepatitis B vaccine.** / Consult your health care provider.  Haemophilus influenzae type b (Hib) vaccine.** / Consult your health care provider. **Family history and personal history of risk and conditions may change your health care provider's recommendations.   This information is not intended to replace advice given to you by your health care provider. Make sure you discuss any questions you have with your health care provider.   Document Released: 12/31/2001 Document Revised: 11/25/2014 Document Reviewed: 04/01/2011 Elsevier Interactive Patient Education 2016   Interactive Patient Education [employer].

## 2019-07-14 LAB — COMPREHENSIVE METABOLIC PANEL
ALT: 21 IU/L (ref 0–44)
AST: 18 IU/L (ref 0–40)
Albumin/Globulin Ratio: 2.4 — ABNORMAL HIGH (ref 1.2–2.2)
Albumin: 4.6 g/dL (ref 4.0–5.0)
Alkaline Phosphatase: 72 IU/L (ref 39–117)
BUN/Creatinine Ratio: 16 (ref 9–20)
BUN: 14 mg/dL (ref 6–20)
Bilirubin Total: 1.1 mg/dL (ref 0.0–1.2)
CO2: 26 mmol/L (ref 20–29)
Calcium: 9.5 mg/dL (ref 8.7–10.2)
Chloride: 107 mmol/L — ABNORMAL HIGH (ref 96–106)
Creatinine, Ser: 0.89 mg/dL (ref 0.76–1.27)
GFR calc Af Amer: 125 mL/min/{1.73_m2} (ref >59)
GFR calc non Af Amer: 108 mL/min/{1.73_m2} (ref >59)
Globulin, Total: 1.9 g/dL (ref 1.5–4.5)
Glucose: 98 mg/dL (ref 65–99)
Potassium: 4.8 mmol/L (ref 3.5–5.2)
Sodium: 145 mmol/L — ABNORMAL HIGH (ref 134–144)
Total Protein: 6.5 g/dL (ref 6.0–8.5)

## 2019-07-14 LAB — CBC WITH DIFFERENTIAL/PLATELET
Basophils Absolute: 0.1 10*3/uL (ref 0.0–0.2)
Basos: 1 %
EOS (ABSOLUTE): 0.1 10*3/uL (ref 0.0–0.4)
Eos: 1 %
Hematocrit: 44.1 % (ref 37.5–51.0)
Hemoglobin: 14.8 g/dL (ref 13.0–17.7)
Immature Grans (Abs): 0 10*3/uL (ref 0.0–0.1)
Immature Granulocytes: 0 %
Lymphocytes Absolute: 1.7 10*3/uL (ref 0.7–3.1)
Lymphs: 30 %
MCH: 30 pg (ref 26.6–33.0)
MCHC: 33.6 g/dL (ref 31.5–35.7)
MCV: 89 fL (ref 79–97)
Monocytes Absolute: 0.6 10*3/uL (ref 0.1–0.9)
Monocytes: 10 %
Neutrophils Absolute: 3.3 10*3/uL (ref 1.4–7.0)
Neutrophils: 58 %
Platelets: 265 10*3/uL (ref 150–450)
RBC: 4.94 x10E6/uL (ref 4.14–5.80)
RDW: 12.9 % (ref 11.6–15.4)
WBC: 5.7 10*3/uL (ref 3.4–10.8)

## 2019-07-14 LAB — LIPID PANEL
Chol/HDL Ratio: 2.5 ratio (ref 0.0–5.0)
Cholesterol, Total: 135 mg/dL (ref 100–199)
HDL: 53 mg/dL (ref >39)
LDL Calculated: 63 mg/dL (ref 0–99)
Triglycerides: 96 mg/dL (ref 0–149)
VLDL Cholesterol Cal: 19 mg/dL (ref 5–40)

## 2019-07-14 LAB — HEMOGLOBIN A1C
Est. average glucose Bld gHb Est-mCnc: 100 mg/dL
Hgb A1c MFr Bld: 5.1 % (ref 4.8–5.6)

## 2019-07-14 LAB — TSH: TSH: 1.87 u[IU]/mL (ref 0.450–4.500)

## 2019-07-14 LAB — VITAMIN D 25 HYDROXY (VIT D DEFICIENCY, FRACTURES): Vit D, 25-Hydroxy: 46.6 ng/mL (ref 30.0–100.0)

## 2019-07-14 LAB — T4, FREE: Free T4: 0.98 ng/dL (ref 0.82–1.77)

## 2019-07-14 LAB — T3: T3, Total: 100 ng/dL (ref 71–180)

## 2019-07-15 ENCOUNTER — Telehealth: Payer: Self-pay | Admitting: Family Medicine

## 2019-07-15 NOTE — Telephone Encounter (Signed)
Patient called back states medical assistant can speak w/ his wife regarding any issues (he signed a DPR).  --Forwarding message to med asst.  --glh

## 2019-07-15 NOTE — Telephone Encounter (Signed)
Spouse informed of lab results.  Charyl Bigger, CMA

## 2019-07-16 ENCOUNTER — Other Ambulatory Visit: Payer: Self-pay

## 2019-07-16 ENCOUNTER — Encounter (HOSPITAL_COMMUNITY): Payer: Self-pay

## 2019-07-16 ENCOUNTER — Ambulatory Visit (HOSPITAL_COMMUNITY)
Admission: EM | Admit: 2019-07-16 | Discharge: 2019-07-16 | Disposition: A | Discharge status: Home / Self Care | Payer: 59 | Attending: Family Medicine | Admitting: Family Medicine

## 2019-07-16 DIAGNOSIS — L237 Allergic contact dermatitis due to plants, except food: Secondary | ICD-10-CM | POA: Diagnosis not present

## 2019-07-16 DIAGNOSIS — T7840XA Allergy, unspecified, initial encounter: Secondary | ICD-10-CM

## 2019-07-16 MED ORDER — METHYLPREDNISOLONE SODIUM SUCC 125 MG IJ SOLR
INTRAMUSCULAR | Status: AC
  Filled 2019-07-16: qty 2

## 2019-07-16 MED ORDER — PREDNISONE 10 MG (21) PO TBPK: ORAL_TABLET | ORAL | 0 refills | Status: DC

## 2019-07-16 MED ORDER — METHYLPREDNISOLONE SODIUM SUCC 125 MG IJ SOLR
80.0000 mg | Freq: Once | INTRAMUSCULAR | Status: AC
  Administered 2019-07-16: 80 mg via INTRAMUSCULAR

## 2019-07-16 NOTE — ED Triage Notes (Signed)
Pt presents with left ear swelling at the bottom of ear along jawline; Pt also presents with right eye swelling and redness and forehead redness & swelling since Wednesday from unknown source.  Pt states he was outside cutting his lawn and when he came back in facial redness and swelling started.

## 2019-07-16 NOTE — Discharge Instructions (Addendum)
Treating you for allergic reaction.  You can take Benadryl or Zyrtec for itching.  If any symptoms your symptoms continue after this treatment or worsen you need to follow-up

## 2019-07-16 NOTE — ED Provider Notes (Signed)
[place]    CSN: [id number] Arrival date & time: 07/16/19  0807      History   Chief Complaint Chief Complaint  Patient presents with  . Facial Swelling    HPI Henry Miranda is a 38 y.o. male.   Patient is a 38 year old male that presents today with allergic reaction.  Reporting that Wednesday he was out doing some yard work.  Symptoms started soon after that.  He is having swelling behind the left ear, anterior neck and right eye swelling with erythema and forehead swelling. The rash is pruritic.  The rash has worsened.  He has not take anything for his symptoms.   Denies any fever, joint pain. Denies any recent changes in lotions, detergents, foods or other possible irritants. No recent travel. Nobody else at home has the rash.  No new foods or medications.   ROS per HPI        History reviewed. No pertinent past medical history.  Patient Active Problem List   Diagnosis Date Noted  . 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 Ringworm of body 07/13/2019  . 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 History of social smoking for 19 years- 1 cig q 3-d 01/20/2018  . BMI 35.0-35.9,adult 01/20/2018  . Family history of cancer 01/20/2018    History reviewed. No pertinent surgical history.     Home Medications    Prior to Admission medications   Medication Sig Start Date End Date Taking? Authorizing Provider  predniSONE (STERAPRED UNI-PAK 21 TAB) 10 MG (21) TBPK tablet 6 tabs for 1 day, then 5 tabs for 1 das, then 4 tabs for 1 day, then 3 tabs for 1 day, 2 tabs for 1 day, then 1 tab for 1 day 07/16/19   Orvan July, NP    Family History Family History  Problem Relation Age of Onset  . Hypertension Mother   . Cancer Father        brain    Social History Social History   Tobacco Use  . Smoking status: Former Smoker    Packs/day: 0.50    Years: 10.00    Pack years: 5.00    Types: Cigarettes    Quit date: 2000   Years since quitting: 20.6  . Smokeless tobacco: Never Used  Substance Use Topics  . Alcohol use: Yes    Alcohol/week: 2.0 - 3.0 standard drinks    Types: 2 - 3 Standard drinks or equivalent per week  . Drug use: No     Allergies   Patient has no known allergies.   Review of Systems Review of Systems   Physical Exam Triage Vital Signs ED Triage Vitals  Enc Vitals Group     BP 07/16/19 0842 130/77     Pulse Rate 07/16/19 0842 63     Resp 07/16/19 0842 17     Temp 07/16/19 0842 99.2 F (37.3 C)     Temp Source 07/16/19 0842 Oral     SpO2 07/16/19 0842 98 %     Weight --      Height --      Head Circumference --      Peak Flow --      Pain Score 07/16/19 0847 4     Pain Loc --      Pain Edu? --      Excl. in [place]? --    No data found.  Updated Vital Signs BP 130/77 (BP Location:  Left Arm)   Pulse 63   Temp 99.2 F (37.3 C) (Oral)   Resp 17   SpO2 98%   Visual Acuity Right Eye Distance:   Left Eye Distance:   Bilateral Distance:    Right Eye Near:   Left Eye Near:    Bilateral Near:     Physical Exam Vitals signs and nursing note reviewed.  Constitutional:      General: He is not in acute distress.    Appearance: He is not ill-appearing, toxic-appearing or diaphoretic.  HENT:     Head: Normocephalic and atraumatic.     Nose: Nose normal.     Mouth/Throat:     Pharynx: Oropharynx is clear.  Eyes:     General:        Right eye: No discharge.        Left eye: No discharge.     Extraocular Movements: Extraocular movements intact.     Pupils: Pupils are equal, round, and reactive to light.     Comments: Right upper lid swelling.   Neck:     Musculoskeletal: Normal range of motion.  Pulmonary:     Effort: Pulmonary effort is normal.  Musculoskeletal: Normal range of motion.  Skin:    General: Skin is warm and dry.     Findings: Rash present.     Comments: Rash to bilateral forearms, erythematous, papular with some vesicles. Swelling to right  upper lid and forehead with erythema. Erythematous papules to anterior neck and behind the left ear.  Neurological:     Mental Status: He is alert.  Psychiatric:        Mood and Affect: Mood normal.      UC Treatments / Results  Labs (all labs ordered are listed, but only abnormal results are displayed) Labs Reviewed - No data to display  EKG   Radiology No results found.  Procedures Procedures (including critical care time)  Medications Ordered in UC Medications  methylPREDNISolone sodium succinate (SOLU-MEDROL) 125 mg/2 mL injection 80 mg (80 mg Intramuscular Given 07/16/19 0909)  methylPREDNISolone sodium succinate (SOLU-MEDROL) 125 mg/2 mL injection (has no administration in time range)    Initial Impression / Assessment and Plan / UC Course  I have reviewed the triage vital signs and the nursing notes.  Pertinent labs & imaging results that were available during my care of the patient were reviewed by me and considered in my medical decision making (see chart for details).     Rash consistent with poison oak or poison ivy dermatitis. Treating with steroid injection here in clinic and sending home with prednisone taper Recommended Benadryl or Zyrtec for itching. Follow up as needed for continued or worsening symptoms  Final Clinical Impressions(s) / UC Diagnoses   Final diagnoses:  Allergic reaction, initial encounter     Discharge Instructions     Treating you for allergic reaction.  You can take Benadryl or Zyrtec for itching.  If any symptoms your symptoms continue after this treatment or worsen you need to follow-up    ED Prescriptions    Medication Sig Dispense Auth. Provider   predniSONE (STERAPRED UNI-PAK 21 TAB) 10 MG (21) TBPK tablet 6 tabs for 1 day, then 5 tabs for 1 das, then 4 tabs for 1 day, then 3 tabs for 1 day, 2 tabs for 1 day, then 1 tab for 1 day 21 tablet Jaci Lazier, Mindie Rawdon A, NP     Controlled Substance Prescriptions [place] Controlled  Substance Registry consulted? Not Applicable  Dahlia ByesBast, Sundee Garland A, NP 07/16/19 1358

## 2019-09-03 ENCOUNTER — Ambulatory Visit: Payer: 59

## 2019-11-29 ENCOUNTER — Telehealth: Payer: 59 | Admitting: Family

## 2019-11-29 DIAGNOSIS — Z20822 Contact with and (suspected) exposure to covid-19: Secondary | ICD-10-CM

## 2019-11-29 MED ORDER — BENZONATATE 100 MG PO CAPS: 100.0000 mg | ORAL_CAPSULE | Freq: Three times a day (TID) | ORAL | 0 refills | Status: DC | PRN

## 2019-11-29 NOTE — Progress Notes (Signed)
E-Visit for Corona Virus Screening   Your current symptoms could be consistent with the coronavirus.  Many health care providers can now test patients at their office but not all are.  [facility] has multiple testing sites. For information on our COVID testing locations and hours go to [email or web address]  We are enrolling you in our MyChart Home Monitoring for COVID19 . Daily you will receive a questionnaire within the MyChart website. Our COVID 19 response team will be monitoring your responses daily.  Testing Information: The COVID-19 Community Testing sites will begin testing BY APPOINTMENT ONLY.  You can schedule online at [email or web address]  If you do not have access to a smart phone or computer you may call [phone] for an appointment.  Testing Locations: Appointment schedule is 8 am to 3:30 pm at all sites  [place] indoors at [address], [place] [address] [facility]  indoors at [address]trance, [place], [place] [address] [place] indoors at [address], [place] [address]  Additional testing sites in the Community:  . For [facility] Testing sites in [place]  https://www.cvs.com/minuteclinic/covid-19-testing  . For Pop-up testing sites in [place]  https://covid19.ncdhhs.gov/about-covid-19/testing/find-my-testing-place/pop-testing-sites  . For Testing sites with regular hours https://onsms.org/[place]/  . For [place] [facility] https://[facility].com/covid-19-community-outreach-testing/  . For [facility] https://www.[place].gov/[place]ounty/human-services/health-department/coronavirus-covid-19-info/covid-19-testing  . For [place] testing in [place] and [place] https://www.[place].gov/[place]-county/human-services/health-department/coronavirus-covid-19-info/covid-19-testing  . For [facility] testing in [place]   https://[facility].care/covidtesting  For  more  information about community testing call [phone]   We are enrolling you in our MyChart Home Monitoring for COVID19 . Daily you will receive a questionnaire within the MyChart website. Our COVID 19 response team will be monitoring your responses daily.  Please quarantine yourself while awaiting your test results. If you develop fever/cough/breathlessness, please stay home for 10 days with improving symptoms and until you have had 24 hours of no fever (without taking a fever reducer).  You should wear a mask or cloth face covering over your nose and mouth if you must be around other people or animals, including pets (even at home). Try to stay at least 6 feet away from other people. This will protect the people around you.  Please continue good preventive care measures, including:  frequent hand-washing, avoid touching your face, cover coughs/sneezes, stay out of crowds and keep a 6 foot distance from others.  COVID-19 is a respiratory illness with symptoms that are similar to the flu. Symptoms are typically mild to moderate, but there have been cases of severe illness and death due to the virus.   The following symptoms may appear 2-14 days after exposure: . Fever . Cough . Shortness of breath or difficulty breathing . Chills . Repeated shaking with chills . Muscle pain . Headache . Sore throat . New loss of taste or smell . Fatigue . Congestion or runny nose . Nausea or vomiting . Diarrhea  Go to the nearest hospital ED for assessment if fever/cough/breathlessness are severe or illness seems like a threat to life.  It is vitally important that if you feel that you have an infection such as this virus or any other virus that you stay home and away from places where you may spread it to others.  You should avoid contact with people age 65 and older.   You can use medication such as A prescription cough medication called Tessalon Perles 100 mg. You may take 1-2 capsules every 8 hours as    needed for cough.  You may also take acetaminophen (Tylenol) as needed for fever.  Reduce your risk of any infection by using the same precautions used for avoiding the common cold or flu:  . Wash your hands often with soap and warm water for at least 20 seconds.  If soap and water are not readily available, use an alcohol-based hand sanitizer with at least 60% alcohol.  . If coughing or sneezing, cover your mouth and nose by coughing or sneezing into the elbow areas of your shirt or coat, into a tissue or into your sleeve (not your hands). . Avoid shaking hands with others and consider head nods or verbal greetings only. . Avoid touching your eyes, nose, or mouth with unwashed hands.  . Avoid close contact with people who are sick. . Avoid places or events with large numbers of people in one location, like concerts or sporting events. . Carefully consider travel plans you have or are making. . If you are planning any travel outside or inside the [place], visit the CDC's Travelers' Health webpage for the latest health notices. . If you have some symptoms but not all symptoms, continue to monitor at home and seek medical attention if your symptoms worsen. . If you are having a medical emergency, call 911.  HOME CARE . Only take medications as instructed by your medical team. . Drink plenty of fluids and get plenty of rest. . A steam or ultrasonic humidifier can help if you have congestion.   GET HELP RIGHT AWAY IF YOU HAVE EMERGENCY WARNING SIGNS** FOR COVID-19. If you or someone is showing any of these signs seek emergency medical care immediately. Call 911 or proceed to your closest emergency facility if: . You develop worsening high fever. . Trouble breathing . Bluish lips or face . Persistent pain or pressure in the chest . New confusion . Inability to wake or stay awake . You cough up blood. . Your symptoms become more severe  **This list is not all possible symptoms. Contact your  medical provider for any symptoms that are sever or concerning to you.  MAKE SURE YOU   Understand these instructions.  Will watch your condition.  Will get help right away if you are not doing well or get worse.  Your e-visit answers were reviewed by a board certified advanced clinical practitioner to complete your personal care plan.  Depending on the condition, your plan could have included both over the counter or prescription medications.  If there is a problem please reply once you have received a response from your provider.  Your safety is important to us.  If you have drug allergies check your prescription carefully.    You can use MyChart to ask questions about today's visit, request a non-urgent call back, or ask for a work or school excuse for 24 hours related to this e-Visit. If it has been greater than 24 hours you will need to follow up with your provider, or enter a new e-Visit to address those concerns. You will get an e-mail in the next two days asking about your experience.  I hope that your e-visit has been valuable and will speed your recovery. Thank you for using e-visits.  Approximately 5 minutes was spent documenting and reviewing patient's chart.    

## 2019-12-01 ENCOUNTER — Ambulatory Visit: Payer: Managed Care, Other (non HMO) | Attending: Internal Medicine

## 2019-12-01 DIAGNOSIS — Z20822 Contact with and (suspected) exposure to covid-19: Secondary | ICD-10-CM

## 2019-12-02 LAB — NOVEL CORONAVIRUS, NAA: SARS-CoV-2, NAA: NOT DETECTED

## 2020-03-14 ENCOUNTER — Ambulatory Visit: Payer: 59 | Admitting: Family Medicine

## 2020-07-23 ENCOUNTER — Encounter: Payer: Self-pay | Admitting: *Deleted

## 2020-07-23 ENCOUNTER — Other Ambulatory Visit: Payer: Self-pay

## 2020-07-23 ENCOUNTER — Ambulatory Visit
Admission: EM | Admit: 2020-07-23 | Discharge: 2020-07-23 | Disposition: A | Discharge status: Home / Self Care | Payer: 59 | Attending: Physician Assistant | Admitting: Physician Assistant

## 2020-07-23 DIAGNOSIS — T63441A Toxic effect of venom of bees, accidental (unintentional), initial encounter: Secondary | ICD-10-CM

## 2020-07-23 DIAGNOSIS — L500 Allergic urticaria: Secondary | ICD-10-CM | POA: Diagnosis not present

## 2020-07-23 DIAGNOSIS — M7989 Other specified soft tissue disorders: Secondary | ICD-10-CM

## 2020-07-23 MED ORDER — PREDNISONE 50 MG PO TABS: 50.0000 mg | ORAL_TABLET | Freq: Every day | ORAL | 0 refills | Status: DC

## 2020-07-23 MED ORDER — DEXAMETHASONE SODIUM PHOSPHATE 10 MG/ML IJ SOLN
10.0000 mg | Freq: Once | INTRAMUSCULAR | Status: AC
  Administered 2020-07-23: 10 mg via INTRAMUSCULAR

## 2020-07-23 NOTE — Discharge Instructions (Signed)
Decadron injection in office today. Continue ice compress, allergy medicine. If symptoms not improving in 1-2 days, can start prednisone as directed. Monitor for signs of infection including spreading redness, warmth, pain, fever

## 2020-07-23 NOTE — ED Triage Notes (Signed)
Reports getting stung by yellow jacket x 2 yesterday evening; one site to left hand and one to abdomen.  Urticaria noted to abdomen only.  Left hand and forearm swollen.  Denies any throat swelling or itching, difficulty breathing or swallowing, any oral sxs. Took Benadryl last night, Claritin today, topical antihistamine today without relief.

## 2020-07-23 NOTE — ED Provider Notes (Signed)
[facility]    CSN: [id number] Arrival date & time: 07/23/20  1412      History   Chief Complaint Chief Complaint  Patient presents with  . Insect Bite  . Urticaria    HPI Henry Miranda is a 39 y.o. male.   39 year old male comes in for left hand swelling, rash after being stung by yellow jacket yesterday. Was stung on left hand and abdomen. Since then, has had antihistamine, topical medications, ice compress without significant relief.  Denies swelling to throat, tripoding, drooling, trismus.  Denies shortness of breath.  However, left hand swelling is still significant, with slight pain to the knuckles, and decreased range of motion. Swelling increasing towards forearm.      History reviewed. No pertinent past medical history.  Patient Active Problem List   Diagnosis Date Noted  . 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 Ringworm of body 07/13/2019  . 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 History of social smoking for 19 years- 1 cig q 3-d 01/20/2018  . BMI 35.0-35.9,adult 01/20/2018  . Family history of cancer 01/20/2018    History reviewed. No pertinent surgical history.     Home Medications    Prior to Admission medications   Medication Sig Start Date End Date Taking? Authorizing Provider  predniSONE (DELTASONE) 50 MG tablet Take 1 tablet (50 mg total) by mouth daily with breakfast. 07/23/20   Belinda Fisher, PA-C    Family History Family History  Problem Relation Age of Onset  . Hypertension Mother   . Cancer Father        brain    Social History Social History   Tobacco Use  . Smoking status: Former Smoker    Packs/day: 0.50    Years: 10.00    Pack years: 5.00    Types: Cigarettes    Quit date: 2000    Years since quitting: 21.6  . Smokeless tobacco: Never Used  Vaping Use  . Vaping Use: Never used  Substance Use Topics  . Alcohol use: Yes    Alcohol/week: 2.0 - 3.0 standard drinks    Types: 2 - 3  Standard drinks or equivalent per week  . Drug use: No     Allergies   Patient has no known allergies.   Review of Systems Review of Systems  Reason unable to perform ROS: See HPI as above.     Physical Exam Triage Vital Signs ED Triage Vitals  Enc Vitals Group     BP 07/23/20 1422 135/89     Pulse Rate 07/23/20 1422 80     Resp 07/23/20 1422 16     Temp 07/23/20 1422 98.8 F (37.1 C)     Temp Source 07/23/20 1422 Oral     SpO2 07/23/20 1422 96 %     Weight --      Height --      Head Circumference --      Peak Flow --      Pain Score 07/23/20 1428 4     Pain Loc --      Pain Edu? --      Excl. in [place]? --    No data found.  Updated Vital Signs BP 135/89 (BP Location: Right Arm)   Pulse 80   Temp 98.8 F (37.1 C) (Oral)   Resp 16   SpO2 96%   Visual Acuity Right Eye Distance:   Left Eye Distance:   Bilateral Distance:  Right Eye Near:   Left Eye Near:    Bilateral Near:     Physical Exam Constitutional:      General: He is not in acute distress.    Appearance: Normal appearance. He is well-developed. He is not toxic-appearing or diaphoretic.  HENT:     Head: Normocephalic and atraumatic.  Eyes:     Conjunctiva/sclera: Conjunctivae normal.     Pupils: Pupils are equal, round, and reactive to light.  Pulmonary:     Effort: Pulmonary effort is normal. No respiratory distress.  Musculoskeletal:     Cervical back: Normal range of motion and neck supple.     Comments: Significant swelling to the left hand, distal forearm.  Area warm to touch without erythema.  No significant tenderness to palpation.  Decreased flexion of the fingers. NVI  Skin:    General: Skin is warm and dry.     Comments: Urticaria to the LLQ where stung.  Neurological:     Mental Status: He is alert and oriented to person, place, and time.      UC Treatments / Results  Labs (all labs ordered are listed, but only abnormal results are displayed) Labs Reviewed - No data to  display  EKG   Radiology No results found.  Procedures Procedures (including critical care time)  Medications Ordered in UC Medications  dexamethasone (DECADRON) injection 10 mg (has no administration in time range)    Initial Impression / Assessment and Plan / UC Course  I have reviewed the triage vital signs and the nursing notes.  Pertinent labs & imaging results that were available during my care of the patient were reviewed by me and considered in my medical decision making (see chart for details).    Decadron injection in office today.  Continue ice compress, antihistamine.  If symptoms do not improving in 1 to 2 days, okay to start prednisone as directed.  Return precautions given.  Final Clinical Impressions(s) / UC Diagnoses   Final diagnoses:  Swelling of left hand  Allergic urticaria  Bee sting, accidental or unintentional, initial encounter   ED Prescriptions    Medication Sig Dispense Auth. Provider   predniSONE (DELTASONE) 50 MG tablet Take 1 tablet (50 mg total) by mouth daily with breakfast. 5 tablet Belinda Fisher, PA-C     PDMP not reviewed this encounter.   Belinda Fisher, PA-C 07/23/20 1546

## 2020-12-21 ENCOUNTER — Telehealth: Payer: Self-pay | Admitting: Physician Assistant
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
## 2020-12-22 ENCOUNTER — Other Ambulatory Visit: Payer: Self-pay

## 2020-12-22 ENCOUNTER — Ambulatory Visit
Admission: RE | Admit: 2020-12-22 | Discharge: 2020-12-22 | Disposition: A | Discharge status: Home / Self Care | Source: Ambulatory Visit | Attending: Emergency Medicine | Admitting: Emergency Medicine

## 2020-12-22 ENCOUNTER — Ambulatory Visit (INDEPENDENT_AMBULATORY_CARE_PROVIDER_SITE_OTHER)

## 2020-12-22 VITALS — BP 140/94 | HR 68 | Temp 98.3°F | Resp 20

## 2020-12-22 DIAGNOSIS — S63609A Unspecified sprain of unspecified thumb, initial encounter: Secondary | ICD-10-CM

## 2020-12-22 DIAGNOSIS — R202 Paresthesia of skin: Secondary | ICD-10-CM | POA: Diagnosis not present

## 2020-12-22 DIAGNOSIS — M545 Low back pain, unspecified: Secondary | ICD-10-CM

## 2020-12-22 DIAGNOSIS — S161XXA Strain of muscle, fascia and tendon at neck level, initial encounter: Secondary | ICD-10-CM | POA: Diagnosis not present

## 2020-12-22 DIAGNOSIS — S93601A Unspecified sprain of right foot, initial encounter: Secondary | ICD-10-CM

## 2020-12-22 DIAGNOSIS — IMO0002 Reserved for concepts with insufficient information to code with codable children: Secondary | ICD-10-CM

## 2020-12-22 DIAGNOSIS — M79671 Pain in right foot: Secondary | ICD-10-CM

## 2020-12-22 MED ORDER — IBUPROFEN 800 MG PO TABS: 800.0000 mg | ORAL_TABLET | Freq: Three times a day (TID) | ORAL | 0 refills | Status: DC

## 2020-12-22 MED ORDER — TIZANIDINE HCL 4 MG PO TABS: 2.0000 mg | ORAL_TABLET | Freq: Four times a day (QID) | ORAL | 0 refills | Status: DC | PRN
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
## 2020-12-22 NOTE — Discharge Instructions (Addendum)
X-rays normal Neck/back pain may worsen for the first 2 to 3 days, suspect gradual resolution of symptoms over the next 2 to 3 weeks Use anti-inflammatories for pain/swelling. You may take up to 800 mg Ibuprofen every 8 hours with food. You may supplement Ibuprofen with Tylenol [phone] mg every 8 hours.  May supplement with tizanidine as needed, this is a muscle relaxer, may cause drowsiness, do not drive or work after taking Alternate ice and heat Follow-up if any symptoms not improving or worsening

## 2020-12-25 ENCOUNTER — Telehealth: Payer: Self-pay | Admitting: Physician Assistant

## 2020-12-25 NOTE — Telephone Encounter (Signed)
Please add Mr. Trier to 12/29/20. Thank you

## 2020-12-26 ENCOUNTER — Telehealth: Payer: Self-pay | Admitting: Physician Assistant

## 2020-12-26 ENCOUNTER — Ambulatory Visit
Admission: RE | Admit: 2020-12-26 | Discharge: 2020-12-26 | Disposition: A | Discharge status: Home / Self Care | Payer: Managed Care, Other (non HMO) | Source: Ambulatory Visit | Attending: Physician Assistant | Admitting: Physician Assistant

## 2020-12-26 ENCOUNTER — Encounter: Payer: Self-pay | Admitting: Physician Assistant

## 2020-12-26 MED ORDER — CYCLOBENZAPRINE HCL 10 MG PO TABS: 10.0000 mg | ORAL_TABLET | Freq: Two times a day (BID) | ORAL | 0 refills | Status: DC | PRN
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
## 2020-12-26 NOTE — Addendum Note (Signed)
Addended by: Sylvester Harder on: 12/26/2020 09:16 AM   Modules accepted: Orders

## 2020-12-26 NOTE — Telephone Encounter (Signed)
Also ordering xray of L hand per [facility].

## 2020-12-29 ENCOUNTER — Other Ambulatory Visit: Payer: Self-pay

## 2020-12-29 ENCOUNTER — Encounter: Payer: Self-pay | Admitting: Physician Assistant

## 2020-12-29 ENCOUNTER — Ambulatory Visit (INDEPENDENT_AMBULATORY_CARE_PROVIDER_SITE_OTHER): Admitting: Physician Assistant

## 2020-12-29 DIAGNOSIS — T07XXXA Unspecified multiple injuries, initial encounter: Secondary | ICD-10-CM | POA: Diagnosis not present

## 2020-12-29 DIAGNOSIS — M79645 Pain in left finger(s): Secondary | ICD-10-CM | POA: Diagnosis not present

## 2020-12-29 DIAGNOSIS — R202 Paresthesia of skin: Secondary | ICD-10-CM | POA: Diagnosis not present
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
## 2021-01-01 ENCOUNTER — Ambulatory Visit: Payer: Managed Care, Other (non HMO) | Admitting: Physician Assistant

## 2021-09-29 ENCOUNTER — Telehealth: Payer: Managed Care, Other (non HMO) | Admitting: Nurse Practitioner

## 2021-09-29 DIAGNOSIS — K219 Gastro-esophageal reflux disease without esophagitis: Secondary | ICD-10-CM

## 2021-09-29 DIAGNOSIS — J069 Acute upper respiratory infection, unspecified: Secondary | ICD-10-CM | POA: Diagnosis not present

## 2021-09-29 DIAGNOSIS — J208 Acute bronchitis due to other specified organisms: Secondary | ICD-10-CM

## 2021-09-29 DIAGNOSIS — B9689 Other specified bacterial agents as the cause of diseases classified elsewhere: Secondary | ICD-10-CM | POA: Diagnosis not present

## 2021-09-29 MED ORDER — OMEPRAZOLE 20 MG PO CPDR: 20.0000 mg | DELAYED_RELEASE_CAPSULE | Freq: Two times a day (BID) | ORAL | 0 refills | Status: DC

## 2021-09-29 MED ORDER — PREDNISONE 5 MG PO TABS: 5.0000 mg | ORAL_TABLET | Freq: Every day | ORAL | 0 refills | Status: AC

## 2021-09-29 NOTE — Progress Notes (Signed)
We are sorry that you are not feeling well.  Here is how we plan to help!  Based on your presentation I believe you most likely have A cough due to reflux. To lessen this cough you can use the over-the counter cough medication Delsym but it is very important that we control the cause of the cough.  I suggest that you begin Prilosec 20 mg twice a day for 2 weeks.  You should see the cough lessen quickly as your reflux ( which may be silent or without burning ) is treated.     In Addition for your other symptoms I have sent: Prednisone 5 mg daily for 6 days  From your responses in the eVisit questionnaire you describe inflammation in the upper respiratory tract which is causing a significant cough.  This is commonly called Bronchitis and has four common causes:   Allergies Viral Infections Acid Reflux Bacterial Infection Allergies, viruses and acid reflux are treated by controlling symptoms or eliminating the cause. An example might be a cough caused by taking certain blood pressure medications. You stop the cough by changing the medication. Another example might be a cough caused by acid reflux. Controlling the reflux helps control the cough.  USE OF BRONCHODILATOR ("RESCUE") INHALERS: There is a risk from using your bronchodilator too frequently.  The risk is that over-reliance on a medication which only relaxes the muscles surrounding the breathing tubes can reduce the effectiveness of medications prescribed to reduce swelling and congestion of the tubes themselves.  Although you feel brief relief from the bronchodilator inhaler, your asthma may actually be worsening with the tubes becoming more swollen and filled with mucus.  This can delay other crucial treatments, such as oral steroid medications. If you need to use a bronchodilator inhaler daily, several times per day, you should discuss this with your provider.  There are probably better treatments that could be used to keep your asthma under  control.     HOME CARE Only take medications as instructed by your medical team. Complete the entire course of an antibiotic. Drink plenty of fluids and get plenty of rest. Avoid close contacts especially the very young and the elderly Cover your mouth if you cough or cough into your sleeve. Always remember to wash your hands A steam or ultrasonic humidifier can help congestion.   GET HELP RIGHT AWAY IF: You develop worsening fever. You become short of breath You cough up blood. Your symptoms persist after you have completed your treatment plan MAKE SURE YOU  Understand these instructions. Will watch your condition. Will get help right away if you are not doing well or get worse.    Thank you for choosing an e-visit.  Your e-visit answers were reviewed by a board certified advanced clinical practitioner to complete your personal care plan. Depending upon the condition, your plan could have included both over the counter or prescription medications.  Please review your pharmacy choice. Make sure the pharmacy is open so you can pick up prescription now. If there is a problem, you may contact your provider through [employer] and have the prescription routed to another pharmacy.  Your safety is important to [place]. If you have drug allergies check your prescription carefully.   For the next 24 hours you can use MyChart to ask questions about today's visit, request a non-urgent call back, or ask for a work or school excuse. You will get an email in the next two days asking about your experience.  I hope that your e-visit has been valuable and will speed your recovery.

## 2021-09-29 NOTE — Progress Notes (Signed)
I have spent 5 minutes in review of e-visit questionnaire, review and updating patient chart, medical decision making and response to patient.  ° °Philomene Haff W Kanya Potteiger, NP ° °  °

## 2021-09-30 MED ORDER — AZITHROMYCIN 250 MG PO TABS: ORAL_TABLET | ORAL | 0 refills | Status: DC

## 2021-09-30 NOTE — Addendum Note (Signed)
Addended by: Margaretann Loveless on: 09/30/2021 11:50 AM   Modules accepted: Orders

## 2021-11-23 ENCOUNTER — Other Ambulatory Visit: Payer: Self-pay | Admitting: Physician Assistant

## 2021-11-23 DIAGNOSIS — Z20828 Contact with and (suspected) exposure to other viral communicable diseases: Secondary | ICD-10-CM

## 2021-11-23 MED ORDER — OSELTAMIVIR PHOSPHATE 75 MG PO CAPS: 75.0000 mg | ORAL_CAPSULE | Freq: Every day | ORAL | 0 refills | Status: DC

## 2022-02-15 ENCOUNTER — Other Ambulatory Visit: Payer: Self-pay | Admitting: Physician Assistant
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
## 2022-08-11 ENCOUNTER — Telehealth: Payer: Managed Care, Other (non HMO) | Admitting: Family

## 2022-08-11 DIAGNOSIS — J069 Acute upper respiratory infection, unspecified: Secondary | ICD-10-CM

## 2022-08-11 MED ORDER — FLUTICASONE PROPIONATE 50 MCG/ACT NA SUSP: 2.0000 | Freq: Every day | NASAL | 6 refills | Status: DC

## 2022-08-11 MED ORDER — BENZONATATE 100 MG PO CAPS: 100.0000 mg | ORAL_CAPSULE | Freq: Three times a day (TID) | ORAL | 0 refills | Status: DC | PRN

## 2022-08-11 NOTE — Progress Notes (Signed)

## 2022-09-08 ENCOUNTER — Telehealth: Payer: Managed Care, Other (non HMO) | Admitting: Nurse Practitioner

## 2022-09-08 DIAGNOSIS — J208 Acute bronchitis due to other specified organisms: Secondary | ICD-10-CM | POA: Diagnosis not present

## 2022-09-08 DIAGNOSIS — B9689 Other specified bacterial agents as the cause of diseases classified elsewhere: Secondary | ICD-10-CM

## 2022-09-08 MED ORDER — PREDNISONE 20 MG PO TABS: 20.0000 mg | ORAL_TABLET | Freq: Every day | ORAL | 0 refills | Status: DC

## 2022-09-08 MED ORDER — AZITHROMYCIN 250 MG PO TABS: ORAL_TABLET | ORAL | 0 refills | Status: DC

## 2022-09-08 NOTE — Progress Notes (Signed)
I have spent 5 minutes in review of e-visit questionnaire, review and updating patient chart, medical decision making and response to patient.  ° °Elgin Carn W Allianna Beaubien, NP ° °  °

## 2022-09-08 NOTE — Progress Notes (Signed)
We are sorry that you are not feeling well.  Here is how we plan to help!  Based on your presentation I believe you most likely have A cough due to bacteria.  When patients have a fever and a productive cough with a change in color or increased sputum production, we are concerned about bacterial bronchitis.  If left untreated it can progress to pneumonia.  If your symptoms do not improve with your treatment plan it is important that you contact your provider.   I have prescribed Azithromyin 250 mg: two tablets now and then one tablet daily for 4 additonal days    In addition I have prescribed prednisone 20 mg for 5 days   From your responses in the eVisit questionnaire you describe inflammation in the upper respiratory tract which is causing a significant cough.  This is commonly called Bronchitis and has four common causes:   Allergies Viral Infections Acid Reflux Bacterial Infection Allergies, viruses and acid reflux are treated by controlling symptoms or eliminating the cause. An example might be a cough caused by taking certain blood pressure medications. You stop the cough by changing the medication. Another example might be a cough caused by acid reflux. Controlling the reflux helps control the cough.  USE OF BRONCHODILATOR ("RESCUE") INHALERS: There is a risk from using your bronchodilator too frequently.  The risk is that over-reliance on a medication which only relaxes the muscles surrounding the breathing tubes can reduce the effectiveness of medications prescribed to reduce swelling and congestion of the tubes themselves.  Although you feel brief relief from the bronchodilator inhaler, your asthma may actually be worsening with the tubes becoming more swollen and filled with mucus.  This can delay other crucial treatments, such as oral steroid medications. If you need to use a bronchodilator inhaler daily, several times per day, you should discuss this with your provider.  There are  probably better treatments that could be used to keep your asthma under control.     HOME CARE Only take medications as instructed by your medical team. Complete the entire course of an antibiotic. Drink plenty of fluids and get plenty of rest. Avoid close contacts especially the very young and the elderly Cover your mouth if you cough or cough into your sleeve. Always remember to wash your hands A steam or ultrasonic humidifier can help congestion.   GET HELP RIGHT AWAY IF: You develop worsening fever. You become short of breath You cough up blood. Your symptoms persist after you have completed your treatment plan MAKE SURE YOU  Understand these instructions. Will watch your condition. Will get help right away if you are not doing well or get worse.    Thank you for choosing an e-visit.  Your e-visit answers were reviewed by a board certified advanced clinical practitioner to complete your personal care plan. Depending upon the condition, your plan could have included both over the counter or prescription medications.  Please review your pharmacy choice. Make sure the pharmacy is open so you can pick up prescription now. If there is a problem, you may contact your provider through [employer] and have the prescription routed to another pharmacy.  Your safety is important to [place]. If you have drug allergies check your prescription carefully.   For the next 24 hours you can use MyChart to ask questions about today's visit, request a non-urgent call back, or ask for a work or school excuse. You will get an email in the next two  days asking about your experience. I hope that your e-visit has been valuable and will speed your recovery.

## 2022-09-16 ENCOUNTER — Encounter: Payer: Self-pay | Admitting: Physician Assistant

## 2022-09-16 ENCOUNTER — Emergency Department (HOSPITAL_BASED_OUTPATIENT_CLINIC_OR_DEPARTMENT_OTHER)
Admission: EM | Admit: 2022-09-16 | Discharge: 2022-09-16 | Disposition: A | Discharge status: Home / Self Care | Payer: Managed Care, Other (non HMO) | Attending: Emergency Medicine | Admitting: Emergency Medicine

## 2022-09-16 ENCOUNTER — Other Ambulatory Visit: Payer: Self-pay

## 2022-09-16 ENCOUNTER — Emergency Department (HOSPITAL_BASED_OUTPATIENT_CLINIC_OR_DEPARTMENT_OTHER): Payer: Managed Care, Other (non HMO) | Admitting: Radiology

## 2022-09-16 ENCOUNTER — Encounter (HOSPITAL_BASED_OUTPATIENT_CLINIC_OR_DEPARTMENT_OTHER): Payer: Self-pay

## 2022-09-16 ENCOUNTER — Ambulatory Visit (INDEPENDENT_AMBULATORY_CARE_PROVIDER_SITE_OTHER): Payer: Managed Care, Other (non HMO) | Admitting: Physician Assistant

## 2022-09-16 VITALS — BP 199/131 | HR 77 | Wt 247.0 lb

## 2022-09-16 DIAGNOSIS — R059 Cough, unspecified: Secondary | ICD-10-CM | POA: Diagnosis not present

## 2022-09-16 DIAGNOSIS — R0602 Shortness of breath: Secondary | ICD-10-CM | POA: Insufficient documentation

## 2022-09-16 DIAGNOSIS — R062 Wheezing: Secondary | ICD-10-CM | POA: Diagnosis not present

## 2022-09-16 DIAGNOSIS — Z87891 Personal history of nicotine dependence: Secondary | ICD-10-CM | POA: Insufficient documentation

## 2022-09-16 DIAGNOSIS — R030 Elevated blood-pressure reading, without diagnosis of hypertension: Secondary | ICD-10-CM | POA: Insufficient documentation

## 2022-09-16 DIAGNOSIS — Z20822 Contact with and (suspected) exposure to covid-19: Secondary | ICD-10-CM | POA: Insufficient documentation

## 2022-09-16 DIAGNOSIS — I160 Hypertensive urgency: Secondary | ICD-10-CM

## 2022-09-16 DIAGNOSIS — J40 Bronchitis, not specified as acute or chronic: Secondary | ICD-10-CM

## 2022-09-16 LAB — CBC WITH DIFFERENTIAL/PLATELET
Abs Immature Granulocytes: 0.04 10*3/uL (ref 0.00–0.07)
Basophils Absolute: 0.1 10*3/uL (ref 0.0–0.1)
Basophils Relative: 1 %
Eosinophils Absolute: 0.1 10*3/uL (ref 0.0–0.5)
Eosinophils Relative: 1 %
HCT: 46.7 % (ref 39.0–52.0)
Hemoglobin: 16.5 g/dL (ref 13.0–17.0)
Immature Granulocytes: 0 %
Lymphocytes Relative: 20 %
Lymphs Abs: 1.9 10*3/uL (ref 0.7–4.0)
MCH: 30.3 pg (ref 26.0–34.0)
MCHC: 35.3 g/dL (ref 30.0–36.0)
MCV: 85.7 fL (ref 80.0–100.0)
Monocytes Absolute: 0.8 10*3/uL (ref 0.1–1.0)
Monocytes Relative: 8 %
Neutro Abs: 6.8 10*3/uL (ref 1.7–7.7)
Neutrophils Relative %: 70 %
Platelets: 271 10*3/uL (ref 150–400)
RBC: 5.45 MIL/uL (ref 4.22–5.81)
RDW: 12.9 % (ref 11.5–15.5)
WBC: 9.7 10*3/uL (ref 4.0–10.5)
nRBC: 0 % (ref 0.0–0.2)

## 2022-09-16 LAB — BRAIN NATRIURETIC PEPTIDE: B Natriuretic Peptide: 10.6 pg/mL (ref 0.0–100.0)

## 2022-09-16 LAB — BASIC METABOLIC PANEL
Anion gap: 14 (ref 5–15)
BUN: 15 mg/dL (ref 6–20)
CO2: 25 mmol/L (ref 22–32)
Calcium: 9.6 mg/dL (ref 8.9–10.3)
Chloride: 99 mmol/L (ref 98–111)
Creatinine, Ser: 1 mg/dL (ref 0.61–1.24)
GFR, Estimated: >60 mL/min (ref >60)
Glucose, Bld: 104 mg/dL — ABNORMAL HIGH (ref 70–99)
Potassium: 4.1 mmol/L (ref 3.5–5.1)
Sodium: 138 mmol/L (ref 135–145)

## 2022-09-16 LAB — RESP PANEL BY RT-PCR (FLU A&B, COVID) ARPGX2
Influenza A by PCR: NEGATIVE
Influenza B by PCR: NEGATIVE
SARS Coronavirus 2 by RT PCR: NEGATIVE

## 2022-09-16 LAB — TROPONIN I (HIGH SENSITIVITY): Troponin I (High Sensitivity): 5 ng/L (ref <18)

## 2022-09-16 LAB — D-DIMER, QUANTITATIVE: D-Dimer, Quant: 0.31 ug/mL-FEU (ref 0.00–0.50)

## 2022-09-16 MED ORDER — GUAIFENESIN-CODEINE 100-10 MG/5ML PO SOLN
10.0000 mL | Freq: Once | ORAL | Status: AC
  Administered 2022-09-16: 10 mL via ORAL
  Filled 2022-09-16: qty 10

## 2022-09-16 MED ORDER — GUAIFENESIN-CODEINE 100-10 MG/5ML PO SOLN: 10.0000 mL | Freq: Three times a day (TID) | ORAL | 0 refills | Status: DC | PRN

## 2022-09-16 MED ORDER — IPRATROPIUM BROMIDE 0.02 % IN SOLN
1.0000 mg | Freq: Once | RESPIRATORY_TRACT | Status: AC
  Administered 2022-09-16: 1 mg via RESPIRATORY_TRACT
  Filled 2022-09-16: qty 5

## 2022-09-16 MED ORDER — IPRATROPIUM-ALBUTEROL 0.5-2.5 (3) MG/3ML IN SOLN
3.0000 mL | Freq: Once | RESPIRATORY_TRACT | Status: AC
  Administered 2022-09-16: 3 mL via RESPIRATORY_TRACT
  Filled 2022-09-16: qty 3

## 2022-09-16 MED ORDER — PREDNISONE 10 MG PO TABS: 60.0000 mg | ORAL_TABLET | Freq: Every day | ORAL | 0 refills | Status: AC

## 2022-09-16 MED ORDER — METHYLPREDNISOLONE SODIUM SUCC 125 MG IJ SOLR
125.0000 mg | Freq: Once | INTRAMUSCULAR | Status: AC
  Administered 2022-09-16: 125 mg via INTRAVENOUS
  Filled 2022-09-16: qty 2

## 2022-09-16 MED ORDER — ALBUTEROL SULFATE (2.5 MG/3ML) 0.083% IN NEBU
10.0000 mg | INHALATION_SOLUTION | Freq: Once | RESPIRATORY_TRACT | Status: AC
  Administered 2022-09-16: 10 mg via RESPIRATORY_TRACT
  Filled 2022-09-16: qty 12

## 2022-09-16 NOTE — ED Provider Notes (Signed)
[facility] Provider Note   CSN: [id number] Arrival date & time: 09/16/22  1348     History  Chief Complaint  Patient presents with   Shortness of Breath    Henry Miranda is a 41 y.o. male.  With history of smoking and obesity who presents to the ED for evaluation of cough and shortness of breath.  Symptoms started in mid September and have remained the same since that time.  He has seen providers on multiple occasions for this.  He was started on azithromycin and prednisone 7 days ago with mild improvement in his symptoms.  She shortness of breath is present with activity and after episodes of coughing.  Cough is occasionally productive of clear sputum.  No chest pain, fevers, chills, nausea, vomiting.  Has elevated blood pressure here today.  Last blood pressure at primary care was 150/100 in February.   Shortness of Breath Associated symptoms: cough        Home Medications Prior to Admission medications   Medication Sig Start Date End Date Taking? Authorizing Provider  azithromycin (ZITHROMAX) 250 MG tablet Take 2 tablets PO on day one, and one tablet PO daily thereafter until completed. 09/08/22   Gildardo Pounds, NP  benzonatate (TESSALON PERLES) 100 MG capsule Take 1 capsule (100 mg total) by mouth 3 (three) times daily as needed. 08/11/22   Sharion Balloon, FNP  cyclobenzaprine (FLEXERIL) 10 MG tablet Take 1 tablet (10 mg total) by mouth 2 (two) times daily as needed for muscle spasms. 12/26/20   Lorrene Reid, PA-C  fluticasone (FLONASE) 50 MCG/ACT nasal spray Place 2 sprays into both nostrils daily. 08/11/22   Evelina Dun A, FNP  ibuprofen (ADVIL) 800 MG tablet Take 1 tablet (800 mg total) by mouth 3 (three) times daily. 12/22/20   Wieters, Hallie C, PA-C  omeprazole (PRILOSEC) 20 MG capsule Take 1 capsule (20 mg total) by mouth 2 (two) times daily before a meal for 14 days. 09/29/21 10/13/21  Gildardo Pounds, NP  oseltamivir (TAMIFLU) 75 MG  capsule Take 1 capsule (75 mg total) by mouth daily. 11/23/21   Lorrene Reid, PA-C  predniSONE (DELTASONE) 20 MG tablet Take 1 tablet (20 mg total) by mouth daily with breakfast. 09/08/22   Gildardo Pounds, NP      Allergies    Pineapple    Review of Systems   Review of Systems  Respiratory:  Positive for cough and shortness of breath.   All other systems reviewed and are negative.   Physical Exam Updated Vital Signs BP (!) 195/115   Pulse 73   Temp 99.1 F (37.3 C)   Resp (!) 24   Ht 5\' 7"  (1.702 m)   Wt 112 kg   SpO2 98%   BMI 38.69 kg/m  Physical Exam Vitals and nursing note reviewed.  Constitutional:      General: He is not in acute distress.    Appearance: Normal appearance. He is well-developed and normal weight. He is not ill-appearing, toxic-appearing or diaphoretic.  HENT:     Head: Normocephalic and atraumatic.  Cardiovascular:     Rate and Rhythm: Normal rate and regular rhythm.     Pulses: Normal pulses.     Heart sounds: Normal heart sounds.  Pulmonary:     Effort: Pulmonary effort is normal. No respiratory distress.     Breath sounds: Wheezing present. No rhonchi or rales.     Comments: Nonproductive cough approximately every 5 minutes Abdominal:  General: Abdomen is flat.  Musculoskeletal:        General: Normal range of motion.     Cervical back: Neck supple.  Skin:    General: Skin is warm and dry.     Capillary Refill: Capillary refill takes less than 2 seconds.  Neurological:     General: No focal deficit present.     Mental Status: He is alert and oriented to person, place, and time.  Psychiatric:        Mood and Affect: Mood normal.        Behavior: Behavior normal.     ED Results / Procedures / Treatments   Labs (all labs ordered are listed, but only abnormal results are displayed) Labs Reviewed  RESP PANEL BY RT-PCR (FLU A&B, COVID) ARPGX2  BASIC METABOLIC PANEL  CBC WITH DIFFERENTIAL/PLATELET  BRAIN NATRIURETIC PEPTIDE   D-DIMER, QUANTITATIVE  TROPONIN I (HIGH SENSITIVITY)    EKG None  Radiology DG Chest 2 View  Result Date: 09/16/2022 CLINICAL DATA:  Shortness of breath EXAM: CHEST - 2 VIEW COMPARISON:  None Available. FINDINGS: The cardiomediastinal silhouette is within normal limits. No pleural effusion. No pneumothorax. No mass or consolidation. No acute osseous abnormality. IMPRESSION: No acute findings in the chest. Electronically Signed   By: Olive Bass M.D.   On: 09/16/2022 15:18    Procedures Procedures    Medications Ordered in ED Medications  ipratropium-albuterol (DUONEB) 0.5-2.5 (3) MG/3ML nebulizer solution 3 mL (has no administration in time range)  methylPREDNISolone sodium succinate (SOLU-MEDROL) 125 mg/2 mL injection 125 mg (has no administration in time range)  albuterol (PROVENTIL) (2.5 MG/3ML) 0.083% nebulizer solution 10 mg (10 mg Nebulization Given 09/16/22 1419)  ipratropium (ATROVENT) nebulizer solution 1 mg (1 mg Nebulization Given 09/16/22 1419)    ED Course/ Medical Decision Making/ A&P Clinical Course as of 09/16/22 1852  Mon Sep 16, 2022  1617 DG Chest 2 View I personally reviewed the image.  No acute cardiopulmonary abnormalities [AS]  1843 Symptoms have slightly improved after steroids, 2 breathing treatments, and Robitussin [AS]    Clinical Course User Index [AS] Valena Ivanov, Edsel Petrin, PA-C                           Medical Decision Making Amount and/or Complexity of Data Reviewed Labs: ordered. Radiology: ordered. Decision-making details documented in ED Course.  Risk OTC drugs. Prescription drug management.  This patient presents to the ED for concern of cough and shortness of breath, this involves an extensive number of treatment options, and is a complaint that carries with it a high risk of complications and morbidity.  The emergent differential diagnosis for shortness of breath includes, but is not limited to, Pulmonary edema,  bronchoconstriction, Pneumonia, Pulmonary embolism, Pneumotherax/ Hemothorax, Dysrythmia, ACS.     Co morbidities that complicate the patient evaluation  Former smoker, elevated blood pressure readings  My initial workup includes chest x-ray, basic labs, ACS rule out, DuoNeb treatments, Solu-Medrol, Robitussin AC  Additional history obtained from: Nursing notes from this visit. Previous records within EMR system patient was seen in urgent care prior to his visit and was referred due to high blood pressure readings Family wife is present provides a portion of the history  I ordered, reviewed and interpreted labs which include: CBC, CMP, troponin, BNP, D-dimer.  All labs within normal limits.   I ordered imaging studies including chest x-ray I independently visualized and interpreted imaging which showed normal  I agree with the radiologist interpretation  Cardiac Monitoring:  The patient was maintained on a cardiac monitor.  I personally viewed and interpreted the cardiac monitored which showed an underlying rhythm of: NSR  Afebrile, hypertensive but otherwise hemodynamically stable.  Patient is a 29 male with a history of smoking and previous elevated blood pressure readings who presents to the ED for evaluation of 3 weeks of a nonproductive cough and shortness of breath.  He was seen 1 week ago and was treated for community-acquired pneumonia with a Z-Pak and short course of prednisone, reported that this slightly improved his symptoms but they returned after he finished his courses.  On initial exam patient appears well overall, but does have a consistent cough.  Patient was treated with 2 breathing treatments, Solu-Medrol, and Robitussin.  After these interventions he reported moderate improvement in his symptoms, however does still have his cough.  Patient is PERC negative and I have low suspicion for PE.  Denies chest pain and I also have low suspicion for ACS.  Patient likely does have  bronchitis possibly secondary to his history of smoking.  We will start him on a second round of prednisone with an elevated dosage.  I will also send patient prescription for Robitussin-AC as this helped his symptoms.  I advised patient to follow-up with his primary care provider within the next week so that they may assess his progress.  Gave patient strict return precautions.  Stable at discharge.  At this time there does not appear to be any evidence of an acute emergency medical condition and the patient appears stable for discharge with appropriate outpatient follow up. Diagnosis was discussed with patient who verbalizes understanding of care plan and is agreeable to discharge. I have discussed return precautions with patient and wife who verbalizes understanding. Patient encouraged to follow-up with their PCP within 1 week. All questions answered.  Patient's case discussed with Dr. Karene Fry who agrees with plan to discharge with follow-up.   Note: Portions of this report may have been transcribed using voice recognition software. Every effort was made to ensure accuracy; however, inadvertent computerized transcription errors may still be present.          Final Clinical Impression(s) / ED Diagnoses Final diagnoses:  None    Rx / DC Orders ED Discharge Orders     None         Michelle Piper, Cordelia Poche 09/16/22 1933    Ernie Avena, MD 09/17/22 1451

## 2022-09-16 NOTE — ED Notes (Signed)
Patient arrived with complaints of shortness of breath. Patient audibly wheezing in triage and short of breath at rest. RT assessment completed in triage and orders placed per RT protocol.
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
## 2022-09-16 NOTE — Progress Notes (Signed)
  Established patient acute visit   Patient: Henry Miranda   DOB: [birth year]   41 y.o. Male  MRN: [id number] Visit Date: 09/16/2022  Chief Complaint  Patient presents with   Cough   Subjective    HPI  Patient c/o bronchitis x 3 weeks. Patient reports wheezing and getting short-winded with activity. States completed zpack and prednisone he was prescribed 09/08/2022 via E-Visit. Patient denies altered mental status or fever.    Medications: Outpatient Medications Prior to Visit  Medication Sig   azithromycin (ZITHROMAX) 250 MG tablet Take 2 tablets PO on day one, and one tablet PO daily thereafter until completed.   benzonatate (TESSALON PERLES) 100 MG capsule Take 1 capsule (100 mg total) by mouth 3 (three) times daily as needed.   cyclobenzaprine (FLEXERIL) 10 MG tablet Take 1 tablet (10 mg total) by mouth 2 (two) times daily as needed for muscle spasms.   fluticasone (FLONASE) 50 MCG/ACT nasal spray Place 2 sprays into both nostrils daily.   ibuprofen (ADVIL) 800 MG tablet Take 1 tablet (800 mg total) by mouth 3 (three) times daily.   omeprazole (PRILOSEC) 20 MG capsule Take 1 capsule (20 mg total) by mouth 2 (two) times daily before a meal for 14 days.   oseltamivir (TAMIFLU) 75 MG capsule Take 1 capsule (75 mg total) by mouth daily.   predniSONE (DELTASONE) 20 MG tablet Take 1 tablet (20 mg total) by mouth daily with breakfast.   No facility-administered medications prior to visit.    Review of Systems Review of Systems:  A fourteen system review of systems was performed and found to be positive as per HPI.       Objective    BP (!) 199/131   Pulse 77   Wt 247 lb (112 kg)   SpO2 97%   BMI 39.27 kg/m    Physical Exam  General:  cooperative, ill-appearing  Neuro:  Alert and oriented,  extra-ocular muscles intact  HEENT:  Normocephalic, atraumatic, neck supple  Skin:  no gross rash, warm, pink. Cardiac:  RRR Respiratory: significant wheezing noted with  inspiration and expiration, Vascular:  Ext warm, no cyanosis apprec.; cap RF less 2 sec. Psych:  No HI/SI, judgement and insight good, Euthymic mood. Full Affect.   No results found for any visits on 09/16/22.  Assessment & Plan     Discussed with patient due to significantly elevated blood pressure and wheezing on exam recommend ED evaluation. BP repeated manually by myself with no change. Oxygen saturation 97%. Pt deferred EMS.    Return if symptoms worsen or fail to improve.        Lorrene Reid, PA-C  [facility] [phone] (phone) [phone] (fax)  [place]

## 2022-09-16 NOTE — ED Notes (Signed)
Complains of pain at IV insertion site. Able to flush IV and withdraw blood, pt cont to complain of pain. IV removed to pt pain at site

## 2022-09-16 NOTE — Discharge Instructions (Addendum)
You have been seen today for your complaint of cough and shortness of breath. Your lab work was reassuring and showed no abnormalities. Your imaging was reassuring and showed no abnormalities. Your discharge medications include prednisone.  This is a steroid.  You should take it as prescribed, in the morning and with food.  You should take it for the entire duration of the prescription. Robitussin-AC.  This is a cough medicine that contains codeine.  He should only take it as needed.  You should not drive or operate heavy machinery while taking this medication. Follow up with: Your primary care provider in 1 week Please seek immediate medical care if you develop any of the following symptoms: Cough up blood. Feel pain in your chest. Have severe shortness of breath. Faint or keep feeling like you are going to faint. Have a severe headache. Have a fever or chills that get worse. At this time there does not appear to be the presence of an emergent medical condition, however there is always the potential for conditions to change. Please read and follow the below instructions.  Do not take your medicine if  develop an itchy rash, swelling in your mouth or lips, or difficulty breathing; call 911 and seek immediate emergency medical attention if this occurs.  You may review your lab tests and imaging results in their entirety on your MyChart account.  Please discuss all results of fully with your primary care provider and other specialist at your follow-up visit.  Note: Portions of this text may have been transcribed using voice recognition software. Every effort was made to ensure accuracy; however, inadvertent computerized transcription errors may still be present.

## 2022-09-23 ENCOUNTER — Ambulatory Visit (INDEPENDENT_AMBULATORY_CARE_PROVIDER_SITE_OTHER): Payer: Managed Care, Other (non HMO) | Admitting: Physician Assistant

## 2022-09-23 ENCOUNTER — Encounter: Payer: Self-pay | Admitting: Physician Assistant

## 2022-09-23 VITALS — BP 170/104 | HR 78 | Temp 98.0°F | Resp 18 | Ht 66.5 in | Wt 245.0 lb

## 2022-09-23 DIAGNOSIS — J40 Bronchitis, not specified as acute or chronic: Secondary | ICD-10-CM | POA: Diagnosis not present

## 2022-09-23 DIAGNOSIS — R062 Wheezing: Secondary | ICD-10-CM | POA: Diagnosis not present

## 2022-09-23 DIAGNOSIS — R030 Elevated blood-pressure reading, without diagnosis of hypertension: Secondary | ICD-10-CM | POA: Diagnosis not present

## 2022-09-23 MED ORDER — METHYLPREDNISOLONE SODIUM SUCC 125 MG IJ SOLR
125.0000 mg | Freq: Once | INTRAMUSCULAR | Status: AC
  Administered 2022-09-23: 125 mg via INTRAMUSCULAR

## 2022-09-23 MED ORDER — METHYLPREDNISOLONE SODIUM SUCC 125 MG IJ SOLR: 125.0000 mg | Freq: Once | INTRAMUSCULAR | 0 refills | Status: DC

## 2022-09-23 MED ORDER — IPRATROPIUM-ALBUTEROL 0.5-2.5 (3) MG/3ML IN SOLN: 3.0000 mL | Freq: Four times a day (QID) | RESPIRATORY_TRACT | 0 refills | Status: DC | PRN

## 2022-09-23 MED ORDER — METHYLPREDNISOLONE 4 MG PO TBPK: ORAL_TABLET | ORAL | 0 refills | Status: DC

## 2022-09-23 NOTE — Patient Instructions (Signed)

## 2022-09-23 NOTE — Progress Notes (Signed)
Established patient visit   Patient: Henry Miranda   DOB: 01/27/1981   41 y.o. Male  MRN: [id number] Visit Date: 09/23/2022  Chief Complaint  Patient presents with   Follow-up   Bronchitis   Subjective    HPI  Patient presents for ER follow-up. Reports completed prednisone Saturday morning, reports wheezing, cough and shortness of breath was improving. Since Saturday feels like he has been regressing with the wheezing. No new symptoms including fever. Patient reports has used his wife's albuterol inhaler which does not help.     Medications: Outpatient Medications Prior to Visit  Medication Sig   guaiFENesin-codeine 100-10 MG/5ML syrup Take 10 mLs by mouth 3 (three) times daily as needed for cough.   azithromycin (ZITHROMAX) 250 MG tablet Take 2 tablets PO on day one, and one tablet PO daily thereafter until completed. (Patient not taking: Reported on 09/23/2022)   benzonatate (TESSALON PERLES) 100 MG capsule Take 1 capsule (100 mg total) by mouth 3 (three) times daily as needed. (Patient not taking: Reported on 09/23/2022)   cyclobenzaprine (FLEXERIL) 10 MG tablet Take 1 tablet (10 mg total) by mouth 2 (two) times daily as needed for muscle spasms. (Patient not taking: Reported on 09/23/2022)   fluticasone (FLONASE) 50 MCG/ACT nasal spray Place 2 sprays into both nostrils daily. (Patient not taking: Reported on 09/23/2022)   ibuprofen (ADVIL) 800 MG tablet Take 1 tablet (800 mg total) by mouth 3 (three) times daily. (Patient not taking: Reported on 09/23/2022)   omeprazole (PRILOSEC) 20 MG capsule Take 1 capsule (20 mg total) by mouth 2 (two) times daily before a meal for 14 days.   oseltamivir (TAMIFLU) 75 MG capsule Take 1 capsule (75 mg total) by mouth daily. (Patient not taking: Reported on 09/23/2022)   No facility-administered medications prior to visit.    Review of Systems Review of Systems:  A fourteen system review of systems was performed and found to be positive as per  HPI.  Last CBC Lab Results  Component Value Date   WBC 9.7 09/16/2022   HGB 16.5 09/16/2022   HCT 46.7 09/16/2022   MCV 85.7 09/16/2022   MCH 30.3 09/16/2022   RDW 12.9 09/16/2022   PLT 271 09/16/2022   Last metabolic panel Lab Results  Component Value Date   GLUCOSE 104 (H) 09/16/2022   NA 138 09/16/2022   K 4.1 09/16/2022   CL 99 09/16/2022   CO2 25 09/16/2022   BUN 15 09/16/2022   CREATININE 1.00 09/16/2022   GFRNONAA >60 09/16/2022   CALCIUM 9.6 09/16/2022   PROT 6.5 07/13/2019   ALBUMIN 4.6 07/13/2019   LABGLOB 1.9 07/13/2019   AGRATIO 2.4 (H) 07/13/2019   BILITOT 1.1 07/13/2019   ALKPHOS 72 07/13/2019   AST 18 07/13/2019   ALT 21 07/13/2019   ANIONGAP 14 09/16/2022       Objective    BP (!) 170/104   Pulse 78   Temp 98 F (36.7 C)   Resp 18   Ht 5' 6.5" (1.689 m)   Wt 245 lb (111.1 kg)   SpO2 98%   BMI 38.95 kg/m  BP Readings from Last 3 Encounters:  09/23/22 (!) 170/104  09/16/22 (!) 167/97  09/16/22 (!) 199/131   Wt Readings from Last 3 Encounters:  09/23/22 245 lb (111.1 kg)  09/16/22 247 lb (112 kg)  09/16/22 247 lb (112 kg)    Physical Exam  General:  Well Developed, well nourished, appropriate for stated age.  Neuro:  Alert  and oriented,  extra-ocular muscles intact  HEENT:  Normocephalic, atraumatic, neck supple  Skin:  no gross rash, warm, pink. Cardiac:  RRR, S1 S2 Respiratory: +wheezing, no rhonchi, crackles or rales. +dry cough Vascular:  Ext warm, no cyanosis apprec.; cap RF less 2 sec. Psych:  No HI/SI, judgement and insight good, Euthymic mood. Full Affect.   No results found for any visits on 09/23/22.  Assessment & Plan      Problem List Items Addressed This Visit   None Visit Diagnoses     Bronchitis    -  Primary   Relevant Medications   methylPREDNISolone (MEDROL DOSEPAK) 4 MG TBPK tablet   ipratropium-albuterol (DUONEB) 0.5-2.5 (3) MG/3ML SOLN   methylPREDNISolone sodium succinate (SOLU-MEDROL) 125 mg/2 mL  injection 125 mg (Completed)   Other Relevant Orders   Ambulatory referral to Pulmonology   For home use only DME Nebulizer machine   Wheezing       Relevant Medications   methylPREDNISolone sodium succinate (SOLU-MEDROL) 125 mg/2 mL injection 125 mg (Completed)   Elevated blood pressure reading          Patient continues with persistent bronchitis which has been ongoing for more than 5 weeks. Chest xray 09/16/2022 was normal, negative for mass or consolidation. Discussed with patient being on extended course of oral corticosteroid, will administer Solu-Medrol 125 mg in office and start steroid taper. Will appreciate pulmonology input as well so will place referral. Patient reports duo-neb was helpful so will provide rx for nebulizer machine and send rx for ipratropium-albuterol neb solution to do every 4-6 hrs as needed. Discussed with patient concern for elevated blood pressure, reports is aware and plans to lose weight before considering medication therapy. Patient was evaluated by ED 09/16/2022. Advised to schedule annual physical and labs in the future.  Return if symptoms worsen or fail to improve, for CPE and FBW in the near future.        Lorrene Reid, PA-C  [facility] [phone] (phone) [phone] (fax)  [place]

## 2022-09-26 ENCOUNTER — Encounter (HOSPITAL_BASED_OUTPATIENT_CLINIC_OR_DEPARTMENT_OTHER): Payer: Self-pay | Admitting: Pulmonary Disease

## 2022-09-26 ENCOUNTER — Ambulatory Visit (INDEPENDENT_AMBULATORY_CARE_PROVIDER_SITE_OTHER): Payer: Managed Care, Other (non HMO) | Admitting: Pulmonary Disease

## 2022-09-26 VITALS — BP 160/110 | HR 72 | Ht 66.0 in | Wt 244.7 lb

## 2022-09-26 DIAGNOSIS — J45909 Unspecified asthma, uncomplicated: Secondary | ICD-10-CM

## 2022-09-26 MED ORDER — TRAMADOL HCL 50 MG PO TABS: 50.0000 mg | ORAL_TABLET | Freq: Every evening | ORAL | 0 refills | Status: DC | PRN

## 2022-09-26 MED ORDER — BUDESONIDE-FORMOTEROL FUMARATE 80-4.5 MCG/ACT IN AERO: 2.0000 | INHALATION_SPRAY | Freq: Two times a day (BID) | RESPIRATORY_TRACT | 12 refills | Status: DC

## 2022-09-26 MED ORDER — BENZONATATE 200 MG PO CAPS: 200.0000 mg | ORAL_CAPSULE | Freq: Three times a day (TID) | ORAL | 1 refills | Status: DC | PRN

## 2022-09-26 NOTE — Patient Instructions (Signed)
Finish prednisone script from the ER.  Symbicort two puffs in the morning and two puffs in the evening, and rinse your mouth after each use.  You can use your nebulizer every 6 hours as needed to help with cough, chest congestion, wheezing, or shortness of breath.  Tessalon 200 mg every 8 hours as needed to help with cough.    Tramadol 50 mg nightly as needed to help with cough.  Sip water when you have the urge to cough.  Avoid forcing a cough or clearing your throat.  Use sugarless candy to keep your mouth moist.  Salt water gargle once or twice per day.  1 teaspoon of honey as needed to help with cough.    Follow up in 4 weeks with Dr. Craige Cotta or a Nurse Practitioner.

## 2022-09-26 NOTE — Progress Notes (Signed)
[facility]  Chief Complaint  Patient presents with   Consult    Acute Bronchitis dx at ED yesterday    Past Surgical History:  He  has no past surgical history on file.  Past Medical History:  COVID 19 in 2020  Constitutional:  BP (!) 160/110 (BP Location: Right Arm, Cuff Size: Large)   Pulse 72   Ht 5\' 6"  (1.676 m)   Wt 244 lb 11.4 oz (111 kg)   SpO2 99%   BMI 39.50 kg/m   Brief Summary:  Henry Miranda is a 41 y.o. male former smoker with subacute cough.      Subjective:   He had a URI in September 2023.  He was treated conservatively.  He was then treated with zithromax and prednisone in late October.  These helped some.  He went to the ER on 09/16/22 with dyspnea and cough.  Chest xray from 09/16/22 was normal.  COVID and influenza swab negative.  He was treated with solumedrol, and nebulizer treatment.  He was started on steroids again and Robitussin AC.  Prednisone and nebulizer treatments this go round help.  When he uses the nebulizer it makes him feel jittery and light headed.  His throat is sore and this makes him want to cough more.  He has wheezing at times and his kids say he sounds like a tea kettle.  He has not have sinus congestion, fever, skin rash, reflux, diarrhea, or leg swelling.  He denies occupational exposures.  He quit smoking several years ago.  He has pet dogs.  No history of asthma or allergies.  He had bronchitis once before.  No family history of respiratory disease.  He had COVID in 2020.  He has been using tea with honey and this helps some.  Physical Exam:   Appearance - well kempt   ENMT - no sinus tenderness, no oral exudate, no LAN, Mallampati 4 airway, no stridor  Respiratory - equal breath sounds bilaterally, no wheezing or rales  CV - s1s2 regular rate and rhythm, no murmurs  Ext - no clubbing, no edema  Skin - no rashes  Psych - normal mood and affect   Pulmonary testing:     Chest Imaging:    Social History:  He  reports that he quit smoking about 23 years ago. His smoking use included cigarettes. He has a 5.00 pack-year smoking history. He has never used smokeless tobacco. He reports current alcohol use of about 2.0 - 3.0 standard drinks of alcohol per week. He reports that he does not use drugs.  Family History:  His family history includes Cancer in his father; Hypertension in his mother.    Discussion:  He likely had a viral upper respiratory infection in September that has triggered asthmatic bronchitis.  I don't think he has an active infection at this time.  Assessment/Plan:   Subacute cough with asthmatic bronchitis. - Finish prednisone script from the ER. - Symbicort two puffs in the morning and two puffs in the evening, and rinse your mouth after each use. - You can use your nebulizer every 6 hours as needed to help with cough, chest congestion, wheezing, or shortness of breath. - Tessalon 200 mg every 8 hours as needed to help with cough.   - Tramadol 50 mg nightly as needed to help with cough. - Sip water when you have the urge to cough. - Avoid forcing a cough or clearing your throat. -  Use sugarless candy to keep your mouth moist. - Salt water gargle once or twice per day. - 1 teaspoon of honey as needed to help with cough.    Time Spent Involved in Patient Care on Day of Examination:  50 minutes  Follow up:   Patient Instructions  Finish prednisone script from the ER.  Symbicort two puffs in the morning and two puffs in the evening, and rinse your mouth after each use.  You can use your nebulizer every 6 hours as needed to help with cough, chest congestion, wheezing, or shortness of breath.  Tessalon 200 mg every 8 hours as needed to help with cough.    Tramadol 50 mg nightly as needed to help with cough.  Sip water when you have the urge to cough.  Avoid forcing a cough or clearing your throat.  Use sugarless candy to  keep your mouth moist.  Salt water gargle once or twice per day.  1 teaspoon of honey as needed to help with cough.    Follow up in 4 weeks with Dr. Craige Cotta or a Nurse Practitioner.  Medication List:   Allergies as of 09/26/2022       Reactions   Pineapple         Medication List        Accurate as of September 26, 2022 10:06 AM. If you have any questions, ask your nurse or doctor.          STOP taking these medications    azithromycin 250 MG tablet Commonly known as: ZITHROMAX Stopped by: Coralyn Helling, MD   cyclobenzaprine 10 MG tablet Commonly known as: FLEXERIL Stopped by: Coralyn Helling, MD   fluticasone 50 MCG/ACT nasal spray Commonly known as: FLONASE Stopped by: Coralyn Helling, MD   guaiFENesin-codeine 100-10 MG/5ML syrup Stopped by: Coralyn Helling, MD   ibuprofen 800 MG tablet Commonly known as: ADVIL Stopped by: Coralyn Helling, MD   methylPREDNISolone 4 MG Tbpk tablet Commonly known as: MEDROL DOSEPAK Stopped by: Coralyn Helling, MD   omeprazole 20 MG capsule Commonly known as: PRILOSEC Stopped by: Coralyn Helling, MD   oseltamivir 75 MG capsule Commonly known as: Tamiflu Stopped by: Coralyn Helling, MD       TAKE these medications    benzonatate 200 MG capsule Commonly known as: TESSALON Take 1 capsule (200 mg total) by mouth 3 (three) times daily as needed for cough. What changed:  medication strength how much to take reasons to take this Changed by: Coralyn Helling, MD   budesonide-formoterol 80-4.5 MCG/ACT inhaler Commonly known as: Symbicort Inhale 2 puffs into the lungs 2 (two) times daily. Started by: Coralyn Helling, MD   ipratropium-albuterol 0.5-2.5 (3) MG/3ML Soln Commonly known as: DUONEB Take 3 mLs by nebulization every 6 (six) hours as needed. What changed: Another medication with the same name was removed. Continue taking this medication, and follow the directions you see here. Changed by: Coralyn Helling, MD   traMADol 50 MG tablet Commonly known  as: ULTRAM Take 1 tablet (50 mg total) by mouth at bedtime as needed (Cough). Started by: Coralyn Helling, MD        Signature:  Coralyn Helling, MD [facility] Pulmonary/Critical Care Pager - [phone] 09/26/2022, 10:06 AM

## 2022-10-18 ENCOUNTER — Ambulatory Visit: Payer: Managed Care, Other (non HMO) | Admitting: Nurse Practitioner

## 2022-11-28 ENCOUNTER — Telehealth: Payer: Managed Care, Other (non HMO) | Admitting: Nurse Practitioner

## 2022-11-28 DIAGNOSIS — M545 Low back pain, unspecified: Secondary | ICD-10-CM | POA: Diagnosis not present

## 2022-11-28 DIAGNOSIS — G8929 Other chronic pain: Secondary | ICD-10-CM | POA: Diagnosis not present

## 2022-11-28 MED ORDER — NAPROXEN 500 MG PO TABS: 500.0000 mg | ORAL_TABLET | Freq: Two times a day (BID) | ORAL | 0 refills | Status: DC

## 2022-11-28 MED ORDER — BACLOFEN 10 MG PO TABS: 10.0000 mg | ORAL_TABLET | Freq: Three times a day (TID) | ORAL | 0 refills | Status: DC

## 2022-11-28 NOTE — Progress Notes (Signed)
I have spent 5 minutes in review of e-visit questionnaire, review and updating patient chart, medical decision making and response to patient.  ° °Kylena Mole W Bianka Liberati, NP ° °  °

## 2022-11-28 NOTE — Progress Notes (Signed)

## 2022-11-29 ENCOUNTER — Other Ambulatory Visit (HOSPITAL_BASED_OUTPATIENT_CLINIC_OR_DEPARTMENT_OTHER): Payer: Self-pay | Admitting: Pulmonary Disease

## 2022-11-29 NOTE — Telephone Encounter (Signed)
Needs office visit first ?

## 2023-03-15 ENCOUNTER — Ambulatory Visit
Admission: RE | Admit: 2023-03-15 | Discharge: 2023-03-15 | Disposition: A | Discharge status: Home / Self Care | Payer: Managed Care, Other (non HMO) | Source: Ambulatory Visit | Attending: Urgent Care

## 2023-03-15 VITALS — BP 169/123 | HR 64 | Temp 98.7°F | Resp 20

## 2023-03-15 DIAGNOSIS — K047 Periapical abscess without sinus: Secondary | ICD-10-CM | POA: Diagnosis not present

## 2023-03-15 DIAGNOSIS — R519 Headache, unspecified: Secondary | ICD-10-CM

## 2023-03-15 DIAGNOSIS — K0889 Other specified disorders of teeth and supporting structures: Secondary | ICD-10-CM | POA: Diagnosis not present

## 2023-03-15 MED ORDER — AMOXICILLIN 875 MG PO TABS: 875.0000 mg | ORAL_TABLET | Freq: Two times a day (BID) | ORAL | 0 refills | Status: DC

## 2023-03-15 NOTE — ED Triage Notes (Signed)
Pt c/o sensitivity to right lower tooth x 1 week that has progressed to pain to right eat and jaw-NAD-steady gait

## 2023-03-15 NOTE — Discharge Instructions (Addendum)
Make sure you follow up with your dental specialist as soon as possible. Start amoxicillin to address possible underlying dental infection.   Please check your blood pressure readings. If it remains higher than 140/90, you would likely need to start blood pressure medicines. Check back with your PCP for this.  For diabetes or elevated blood sugar, please make sure you are limiting and avoiding starchy, carbohydrate foods like pasta, breads, sweet breads, pastry, rice, potatoes, desserts. These foods can elevate your blood sugar. Also, limit and avoid drinks that contain a lot of sugar such as sodas, sweet teas, fruit juices.  Drinking plain water will be much more helpful, try 64 ounces of water daily.  It is okay to flavor your water naturally by cutting cucumber, lemon, mint or lime, placing it in a picture with water and drinking it over a period of 24-48 hours as long as it remains refrigerated.  For elevated blood pressure, make sure you are monitoring salt in your diet.  Do not eat restaurant foods and limit processed foods at home. I highly recommend you prepare and cook your own foods at home.  Processed foods include things like frozen meals, pre-seasoned meats and dinners, deli meats, canned foods as these foods contain a high amount of sodium/salt.  Make sure you are paying attention to sodium labels on foods you buy at the grocery store. Buy your spices separately such as garlic powder, onion powder, cumin, cayenne, parsley flakes so that you can avoid seasonings that contain salt. However, salt-free seasonings are available and can be used, an example is Mrs. Dash and includes a lot of different mixtures that do not contain salt.  Lastly, when cooking using oils that are healthier for you is important. This includes olive oil, avocado oil, canola oil. We have discussed a lot of foods to avoid but below is a list of foods that can be very healthy to use in your diet whether it is for diabetes,  cholesterol, high blood pressure, or in general healthy eating.  Salads - kale, spinach, cabbage, spring mix, arugula Fruits - avocadoes, berries (blueberries, raspberries, blackberries), apples, oranges, pomegranate, grapefruit, kiwi Vegetables - asparagus, cauliflower, broccoli, green beans, brussel sprouts, bell peppers, beets; stay away from or limit starchy vegetables like potatoes, carrots, peas Other general foods - kidney beans, egg whites, almonds, walnuts, sunflower seeds, pumpkin seeds, fat free yogurt, almond milk, flax seeds, quinoa, oats  Meat - It is better to eat lean meats and limit your red meat including pork to once a week.  Wild caught fish, chicken breast are good options as they tend to be leaner sources of good protein. Still be mindful of the sodium labels for the meats you buy.  DO NOT EAT ANY FOODS ON THIS LIST THAT YOU ARE ALLERGIC TO. For more specific needs, I highly recommend consulting a dietician or nutritionist but this can definitely be a good starting point.

## 2023-03-15 NOTE — ED Provider Notes (Signed)
[facility]  Note:  This document was prepared using Conservation officer, historic buildings and may include unintentional dictation errors.  MRN: [id number] DOB: June 30, 1981  Subjective:   Henry Miranda is a 43 y.o. male presenting for 1 week history of persistent right sided lower dental pain, facial pain. Symptoms radiate into the right ear. No left sided symptoms. No chest symptoms, fever. No drainage of pus or bleeding. Has had dental work done to the posterior right lower molars. No history of heart disease, kidney disease, stroke. Is not taking pain medications. No official diagnosis of HTN. Has a PCP to follow up with.   No current facility-administered medications for this encounter.  Current Outpatient Medications:    baclofen (LIORESAL) 10 MG tablet, Take 1 tablet (10 mg total) by mouth 3 (three) times daily., Disp: 30 each, Rfl: 0   benzonatate (TESSALON) 200 MG capsule, Take 1 capsule (200 mg total) by mouth 3 (three) times daily as needed for cough., Disp: 30 capsule, Rfl: 1   budesonide-formoterol (SYMBICORT) 80-4.5 MCG/ACT inhaler, Inhale 2 puffs into the lungs 2 (two) times daily., Disp: 1 each, Rfl: 12   ipratropium-albuterol (DUONEB) 0.5-2.5 (3) MG/3ML SOLN, Take 3 mLs by nebulization every 6 (six) hours as needed., Disp: , Rfl:    naproxen (NAPROSYN) 500 MG tablet, Take 1 tablet (500 mg total) by mouth 2 (two) times daily with a meal., Disp: 30 tablet, Rfl: 0   traMADol (ULTRAM) 50 MG tablet, Take 1 tablet (50 mg total) by mouth at bedtime as needed (Cough)., Disp: 10 tablet, Rfl: 0   Allergies  Allergen Reactions   Pineapple     History reviewed. No pertinent past medical history.   History reviewed. No pertinent surgical history.  Family History  Problem Relation Age of Onset   Hypertension Mother    Cancer Father        brain    Social History   Tobacco Use   Smoking status: Former    Packs/day: 0.50    Years: 10.00    Additional  pack years: 0.00    Total pack years: 5.00    Types: Cigarettes    Quit date: 2000    Years since quitting: 24.3   Smokeless tobacco: Never  Vaping Use   Vaping Use: Never used  Substance Use Topics   Alcohol use: Yes    Comment: occ   Drug use: No    ROS   Objective:   Vitals: BP (!) 169/123 (BP Location: Right Arm)   Pulse 64   Temp 98.7 F (37.1 C) (Oral)   Resp 20   SpO2 95%   BP Readings from Last 3 Encounters:  03/15/23 (!) 169/123  09/26/22 (!) 160/110  09/23/22 (!) 170/104   Physical Exam Constitutional:      General: He is not in acute distress.    Appearance: Normal appearance. He is well-developed and normal weight. He is not ill-appearing, toxic-appearing or diaphoretic.  HENT:     Head: Normocephalic and atraumatic.     Right Ear: Tympanic membrane, ear canal and external ear normal. No drainage, swelling or tenderness. No middle ear effusion. There is no impacted cerumen. Tympanic membrane is not erythematous or bulging.     Left Ear: Tympanic membrane, ear canal and external ear normal. No drainage, swelling or tenderness.  No middle ear effusion. There is no impacted cerumen. Tympanic membrane is not erythematous or bulging.     Nose: Nose normal. No congestion  or rhinorrhea.     Mouth/Throat:     Mouth: Mucous membranes are moist.     Pharynx: No pharyngeal swelling, oropharyngeal exudate, posterior oropharyngeal erythema or uvula swelling.     Tonsils: No tonsillar exudate or tonsillar abscesses. 0 on the right. 0 on the left.   Eyes:     General: No scleral icterus.       Right eye: No discharge.        Left eye: No discharge.     Extraocular Movements: Extraocular movements intact.     Conjunctiva/sclera: Conjunctivae normal.  Cardiovascular:     Rate and Rhythm: Normal rate.  Pulmonary:     Effort: Pulmonary effort is normal.  Musculoskeletal:     Cervical back: Normal range of motion and neck supple. No rigidity. No muscular tenderness.   Neurological:     General: No focal deficit present.     Mental Status: He is alert and oriented to person, place, and time.  Psychiatric:        Mood and Affect: Mood normal.        Behavior: Behavior normal.        Thought Content: Thought content normal.        Judgment: Judgment normal.     Assessment and Plan :   PDMP not reviewed this encounter.  1. Dental infection   2. Facial pain   3. Pain, dental     Patient declined medications for pain. Start amoxicillin to address a possible developing dental infection. Discussed his blood pressure and likely HTN that would need medical therapy. Will follow up with his PCP and monitor for his bp at home. Counseled patient on potential for adverse effects with medications prescribed/recommended today, ER and return-to-clinic precautions discussed, patient verbalized understanding.    Wallis Bamberg, PA-C 03/15/23 1239

## 2023-03-29 ENCOUNTER — Emergency Department (HOSPITAL_BASED_OUTPATIENT_CLINIC_OR_DEPARTMENT_OTHER)
Admission: EM | Admit: 2023-03-29 | Discharge: 2023-03-29 | Disposition: A | Discharge status: Home / Self Care | Payer: Managed Care, Other (non HMO) | Attending: Emergency Medicine | Admitting: Emergency Medicine

## 2023-03-29 ENCOUNTER — Encounter (HOSPITAL_BASED_OUTPATIENT_CLINIC_OR_DEPARTMENT_OTHER): Payer: Self-pay | Admitting: Emergency Medicine

## 2023-03-29 ENCOUNTER — Other Ambulatory Visit: Payer: Self-pay

## 2023-03-29 DIAGNOSIS — I10 Essential (primary) hypertension: Secondary | ICD-10-CM | POA: Insufficient documentation

## 2023-03-29 DIAGNOSIS — R42 Dizziness and giddiness: Secondary | ICD-10-CM | POA: Diagnosis not present

## 2023-03-29 LAB — CBC WITH DIFFERENTIAL/PLATELET
Abs Immature Granulocytes: 0.02 10*3/uL (ref 0.00–0.07)
Basophils Absolute: 0.1 10*3/uL (ref 0.0–0.1)
Basophils Relative: 1 %
Eosinophils Absolute: 0.1 10*3/uL (ref 0.0–0.5)
Eosinophils Relative: 1 %
HCT: 47 % (ref 39.0–52.0)
Hemoglobin: 16.5 g/dL (ref 13.0–17.0)
Immature Granulocytes: 0 %
Lymphocytes Relative: 26 %
Lymphs Abs: 2 10*3/uL (ref 0.7–4.0)
MCH: 30.2 pg (ref 26.0–34.0)
MCHC: 35.1 g/dL (ref 30.0–36.0)
MCV: 86.1 fL (ref 80.0–100.0)
Monocytes Absolute: 0.7 10*3/uL (ref 0.1–1.0)
Monocytes Relative: 9 %
Neutro Abs: 4.8 10*3/uL (ref 1.7–7.7)
Neutrophils Relative %: 63 %
Platelets: 229 10*3/uL (ref 150–400)
RBC: 5.46 MIL/uL (ref 4.22–5.81)
RDW: 13.3 % (ref 11.5–15.5)
WBC: 7.8 10*3/uL (ref 4.0–10.5)
nRBC: 0 % (ref 0.0–0.2)

## 2023-03-29 LAB — BASIC METABOLIC PANEL
Anion gap: 7 (ref 5–15)
BUN: 18 mg/dL (ref 6–20)
CO2: 28 mmol/L (ref 22–32)
Calcium: 9.1 mg/dL (ref 8.9–10.3)
Chloride: 104 mmol/L (ref 98–111)
Creatinine, Ser: 1.04 mg/dL (ref 0.61–1.24)
GFR, Estimated: >60 mL/min (ref >60)
Glucose, Bld: 81 mg/dL (ref 70–99)
Potassium: 3.9 mmol/L (ref 3.5–5.1)
Sodium: 139 mmol/L (ref 135–145)

## 2023-03-29 LAB — TSH: TSH: 2.75 u[IU]/mL (ref 0.350–4.500)

## 2023-03-29 MED ORDER — VALSARTAN 80 MG PO TABS: 80.0000 mg | ORAL_TABLET | Freq: Every day | ORAL | 1 refills | Status: DC

## 2023-03-29 MED ORDER — ACETAMINOPHEN 325 MG PO TABS
650.0000 mg | ORAL_TABLET | Freq: Once | ORAL | Status: AC
  Administered 2023-03-29: 650 mg via ORAL
  Filled 2023-03-29: qty 2

## 2023-03-29 MED ORDER — SODIUM CHLORIDE 0.9 % IV BOLUS
1000.0000 mL | Freq: Once | INTRAVENOUS | Status: AC
  Administered 2023-03-29: 1000 mL via INTRAVENOUS

## 2023-03-29 NOTE — ED Triage Notes (Signed)
Pt went to urgent care 2 weeks ago for right jaw pain,dx broken tooth at [facility] and they noticed his BP was high. His wife checked bp this am and was in the 200's,pt does endorse headaches past 2 weeks.

## 2023-03-29 NOTE — ED Provider Notes (Signed)
[place] EMERGENCY DEPARTMENT AT [facility] Provider Note   CSN: [id number] Arrival date & time: 03/29/23  7829     History  Chief Complaint  Patient presents with   Headache    Henry Miranda is a 42 y.o. male.  Patient is a 42 year old male with no known past medical history presenting to the emergency department with high blood pressure.  Patient states that around 2 weeks ago he broke a tooth and was seen in urgent care and was told at that time that his blood pressure was really high and was recommended to follow-up with his doctor for blood pressure recheck.  He states that this morning he checked his blood pressure and noticed that it was in the 220s systolic.  He states that he has been having intermittent headaches for the last week and did have a headache this morning.  He states that his headache is bitemporal and occasionally will be in the right side of his occiput.  He denies sudden onset of his headache.  He denies any nausea, vomiting, vision changes, numbness or weakness, chest pain.  He states that he has been taking Advil for his tooth pain.  Patient was also requesting labs for TSH for unexplained weight gain over the last month.  The history is provided by the patient.  Headache      Home Medications Prior to Admission medications   Medication Sig Start Date End Date Taking? Authorizing Provider  valsartan (DIOVAN) 80 MG tablet Take 1 tablet (80 mg total) by mouth daily. 03/29/23 05/28/23 Yes Theresia Lo, Benetta Spar K, DO  amoxicillin (AMOXIL) 875 MG tablet Take 1 tablet (875 mg total) by mouth 2 (two) times daily. 03/15/23   Wallis Bamberg, PA-C  baclofen (LIORESAL) 10 MG tablet Take 1 tablet (10 mg total) by mouth 3 (three) times daily. 11/28/22   Claiborne Rigg, NP  benzonatate (TESSALON) 200 MG capsule Take 1 capsule (200 mg total) by mouth 3 (three) times daily as needed for cough. 09/26/22   Coralyn Helling, MD  budesonide-formoterol (SYMBICORT) 80-4.5  MCG/ACT inhaler Inhale 2 puffs into the lungs 2 (two) times daily. 09/26/22   Coralyn Helling, MD  ipratropium-albuterol (DUONEB) 0.5-2.5 (3) MG/3ML SOLN Take 3 mLs by nebulization every 6 (six) hours as needed.    [provider]  naproxen (NAPROSYN) 500 MG tablet Take 1 tablet (500 mg total) by mouth 2 (two) times daily with a meal. 11/28/22   Claiborne Rigg, NP  traMADol (ULTRAM) 50 MG tablet Take 1 tablet (50 mg total) by mouth at bedtime as needed (Cough). 09/26/22   Coralyn Helling, MD      Allergies    Pineapple    Review of Systems   Review of Systems  Neurological:  Positive for headaches.    Physical Exam Updated Vital Signs BP (!) 189/120   Pulse 61   Temp 98.1 F (36.7 C) (Oral)   Resp 19   SpO2 97%  Physical Exam Vitals and nursing note reviewed.  Constitutional:      General: He is not in acute distress.    Appearance: He is well-developed.  HENT:     Head: Normocephalic and atraumatic.     Mouth/Throat:     Mouth: Mucous membranes are moist.  Eyes:     General: No visual field deficit.    Extraocular Movements: Extraocular movements intact.     Pupils: Pupils are equal, round, and reactive to light.  Cardiovascular:     Rate  and Rhythm: Normal rate and regular rhythm.     Heart sounds: Normal heart sounds.  Pulmonary:     Effort: Pulmonary effort is normal.     Breath sounds: Normal breath sounds.  Abdominal:     Palpations: Abdomen is soft.  Musculoskeletal:        General: Normal range of motion.     Cervical back: Normal range of motion and neck supple.  Skin:    General: Skin is warm and dry.  Neurological:     Mental Status: He is alert and oriented to person, place, and time.     GCS: GCS eye subscore is 4. GCS verbal subscore is 5. GCS motor subscore is 6.     Cranial Nerves: No cranial nerve deficit, dysarthria or facial asymmetry.     Sensory: No sensory deficit.     Motor: No weakness.     Coordination: Coordination normal.   Psychiatric:        Mood and Affect: Mood normal.        Speech: Speech normal.        Behavior: Behavior normal.     ED Results / Procedures / Treatments   Labs (all labs ordered are listed, but only abnormal results are displayed) Labs Reviewed  BASIC METABOLIC PANEL  CBC WITH DIFFERENTIAL/PLATELET  TSH  CBC WITH DIFFERENTIAL/PLATELET    EKG EKG Interpretation  Date/Time:  Saturday Mar 29 2023 08:49:40 EDT Ventricular Rate:  69 PR Interval:  173 QRS Duration: 106 QT Interval:  426 QTC Calculation: 457 R Axis:   113 Text Interpretation: Sinus rhythm Right axis deviation Confirmed by Elayne Snare ([id number]) on 03/29/2023 9:03:51 AM  Radiology No results found.  Procedures Procedures    Medications Ordered in ED Medications  acetaminophen (TYLENOL) tablet 650 mg (650 mg Oral Given 03/29/23 0907)  sodium chloride 0.9 % bolus 1,000 mL (1,000 mLs Intravenous New Bag/Given 03/29/23 0950)    ED Course/ Medical Decision Making/ A&P Clinical Course as of 03/29/23 1035  Sat Mar 29, 2023  1026 Upon reassessment, the patient's blood pressure has improved down to the 170s over 100s the patient states that his headache has resolved however he does report he has been feeling dizzy and heart palpitations every time he tries to doze off.  He has remained in a normal sinus rhythm on monitor and labs are without electrolyte abnormalities or anemia with no more thyroid function or unclear etiology of the palpitations and dizziness.  He is stable for discharge home.  Normal Cr on labs. Plan to start on Losartan and recommended PCP follow up for BP recheck. [VK]    Clinical Course User Index [VK] Rexford Maus, DO                             Medical Decision Making This patient presents to the ED with chief complaint(s) of HTN, headaches with no pertinent past medical history which further complicates the presenting complaint. The complaint involves an extensive differential  diagnosis and also carries with it a high risk of complications and morbidity.    The differential diagnosis includes asymptomatic hypertension, hypertensive urgency, no signs or symptoms for hypertensive emergency on exam, headache was not sudden onset and has no focal neurologic deficits making ICH or mass effect unlikely  Additional history obtained: Additional history obtained from spouse Records reviewed urgent care records  ED Course and Reassessment: On patient's arrival to the emergency  department his blood pressure was 222/125 and he is otherwise hemodynamically stable in no acute distress with no focal neurologic deficits.  Due to patient's extreme hypertension, labs will be performed to evaluate for endorgan damage.  EKG on arrival had no acute ischemic changes.  The patient did have multiple high blood pressure readings on past visits in the outside records and likely has uncontrolled hypertension.  Independent labs interpretation:  The following labs were independently interpreted: within normal range  Independent visualization of imaging: N/A  Consultation: - Consulted or discussed management/test interpretation w/ external professional: N/A  Consideration for admission or further workup: Patient has no emergent conditions requiring admission or further work-up at this time and is stable for discharge home with primary care follow-up  Social Determinants of health: N/A    Amount and/or Complexity of Data Reviewed Labs: ordered.  Risk OTC drugs. Prescription drug management.          Final Clinical Impression(s) / ED Diagnoses Final diagnoses:  Uncontrolled hypertension  Dizziness    Rx / DC Orders ED Discharge Orders          Ordered    valsartan (DIOVAN) 80 MG tablet  Daily        03/29/23 1035              Rexford Maus, DO 03/29/23 1035

## 2023-03-29 NOTE — Discharge Instructions (Addendum)
You were seen in the emergency department for your high blood pressure and your headaches.  You had no signs of stroke or injury to your kidneys from your blood pressure being high.  It has been high on multiple readings so we need to start you on a blood pressure medication and you should take this as prescribed.  You can keep a log of your blood pressures and I recommend checking your blood pressure at the same time every day when you have been resting for at least 15 minutes to get an accurate reading.  You should follow-up with your primary doctor in the next 1 to 2 weeks to have your symptoms and blood pressure rechecked to see if you need any medication adjustments.  You should return to the emergency department for significantly worsening headaches, numbness or weakness on one-sided body compared to the other, severe chest pain or if you have any other new or concerning symptoms.

## 2023-04-04 ENCOUNTER — Other Ambulatory Visit: Payer: Self-pay

## 2023-04-04 ENCOUNTER — Emergency Department (HOSPITAL_COMMUNITY): Payer: Managed Care, Other (non HMO)

## 2023-04-04 ENCOUNTER — Encounter (HOSPITAL_COMMUNITY): Payer: Self-pay

## 2023-04-04 ENCOUNTER — Emergency Department (HOSPITAL_COMMUNITY)
Admission: EM | Admit: 2023-04-04 | Discharge: 2023-04-04 | Disposition: A | Discharge status: Home / Self Care | Payer: Managed Care, Other (non HMO) | Attending: Emergency Medicine | Admitting: Emergency Medicine

## 2023-04-04 ENCOUNTER — Telehealth: Payer: Managed Care, Other (non HMO) | Admitting: Family Medicine

## 2023-04-04 DIAGNOSIS — Z87891 Personal history of nicotine dependence: Secondary | ICD-10-CM | POA: Insufficient documentation

## 2023-04-04 DIAGNOSIS — Z79899 Other long term (current) drug therapy: Secondary | ICD-10-CM | POA: Insufficient documentation

## 2023-04-04 DIAGNOSIS — I10 Essential (primary) hypertension: Secondary | ICD-10-CM

## 2023-04-04 DIAGNOSIS — R519 Headache, unspecified: Secondary | ICD-10-CM | POA: Diagnosis present

## 2023-04-04 HISTORY — DX: Essential (primary) hypertension: I10

## 2023-04-04 LAB — CBC
HCT: 46.3 % (ref 39.0–52.0)
Hemoglobin: 15.8 g/dL (ref 13.0–17.0)
MCH: 29.6 pg (ref 26.0–34.0)
MCHC: 34.1 g/dL (ref 30.0–36.0)
MCV: 86.7 fL (ref 80.0–100.0)
Platelets: 278 10*3/uL (ref 150–400)
RBC: 5.34 MIL/uL (ref 4.22–5.81)
RDW: 13.2 % (ref 11.5–15.5)
WBC: 7.7 10*3/uL (ref 4.0–10.5)
nRBC: 0 % (ref 0.0–0.2)

## 2023-04-04 LAB — BASIC METABOLIC PANEL
Anion gap: 7 (ref 5–15)
BUN: 16 mg/dL (ref 6–20)
CO2: 29 mmol/L (ref 22–32)
Calcium: 9 mg/dL (ref 8.9–10.3)
Chloride: 102 mmol/L (ref 98–111)
Creatinine, Ser: 0.98 mg/dL (ref 0.61–1.24)
GFR, Estimated: >60 mL/min (ref >60)
Glucose, Bld: 97 mg/dL (ref 70–99)
Potassium: 3.9 mmol/L (ref 3.5–5.1)
Sodium: 138 mmol/L (ref 135–145)

## 2023-04-04 LAB — TROPONIN I (HIGH SENSITIVITY)
Troponin I (High Sensitivity): 6 ng/L (ref <18)
Troponin I (High Sensitivity): 7 ng/L (ref <18)

## 2023-04-04 MED ORDER — HYDRALAZINE HCL 10 MG PO TABS: 10.0000 mg | ORAL_TABLET | Freq: Three times a day (TID) | ORAL | 0 refills | Status: AC | PRN

## 2023-04-04 MED ORDER — KETOROLAC TROMETHAMINE 15 MG/ML IJ SOLN
15.0000 mg | Freq: Once | INTRAMUSCULAR | Status: AC
  Administered 2023-04-04: 15 mg via INTRAVENOUS
  Filled 2023-04-04: qty 1

## 2023-04-04 MED ORDER — DIPHENHYDRAMINE HCL 50 MG/ML IJ SOLN
25.0000 mg | Freq: Once | INTRAMUSCULAR | Status: AC
  Administered 2023-04-04: 25 mg via INTRAVENOUS
  Filled 2023-04-04: qty 1

## 2023-04-04 MED ORDER — PROCHLORPERAZINE EDISYLATE 10 MG/2ML IJ SOLN
10.0000 mg | Freq: Once | INTRAMUSCULAR | Status: AC
  Administered 2023-04-04: 10 mg via INTRAVENOUS
  Filled 2023-04-04: qty 2

## 2023-04-04 MED ORDER — HYDRALAZINE HCL 10 MG PO TABS
10.0000 mg | ORAL_TABLET | Freq: Once | ORAL | Status: AC
  Administered 2023-04-04: 10 mg via ORAL
  Filled 2023-04-04: qty 1

## 2023-04-04 MED ORDER — SODIUM CHLORIDE 0.9 % IV BOLUS
500.0000 mL | Freq: Once | INTRAVENOUS | Status: AC
  Administered 2023-04-04: 500 mL via INTRAVENOUS

## 2023-04-04 NOTE — Patient Instructions (Signed)
Hypertension, Adult High blood pressure (hypertension) is when the force of blood pumping through the arteries is too strong. The arteries are the blood vessels that carry blood from the heart throughout the body. Hypertension forces the heart to work harder to pump blood and may cause arteries to become narrow or stiff. Untreated or uncontrolled hypertension can lead to a heart attack, heart failure, a stroke, kidney disease, and other problems. A blood pressure reading consists of a higher number over a lower number. Ideally, your blood pressure should be below 120/80. The first ("top") number is called the systolic pressure. It is a measure of the pressure in your arteries as your heart beats. The second ("bottom") number is called the diastolic pressure. It is a measure of the pressure in your arteries as the heart relaxes. What are the causes? The exact cause of this condition is not known. There are some conditions that result in high blood pressure. What increases the risk? Certain factors may make you more likely to develop high blood pressure. Some of these risk factors are under your control, including: Smoking. Not getting enough exercise or physical activity. Being overweight. Having too much fat, sugar, calories, or salt (sodium) in your diet. Drinking too much alcohol. Other risk factors include: Having a personal history of heart disease, diabetes, high cholesterol, or kidney disease. Stress. Having a family history of high blood pressure and high cholesterol. Having obstructive sleep apnea. Age. The risk increases with age. What are the signs or symptoms? High blood pressure may not cause symptoms. Very high blood pressure (hypertensive crisis) may cause: Headache. Fast or irregular heartbeats (palpitations). Shortness of breath. Nosebleed. Nausea and vomiting. Vision changes. Severe chest pain, dizziness, and seizures. How is this diagnosed? This condition is diagnosed by  measuring your blood pressure while you are seated, with your arm resting on a flat surface, your legs uncrossed, and your feet flat on the floor. The cuff of the blood pressure monitor will be placed directly against the skin of your upper arm at the level of your heart. Blood pressure should be measured at least twice using the same arm. Certain conditions can cause a difference in blood pressure between your right and left arms. If you have a high blood pressure reading during one visit or you have normal blood pressure with other risk factors, you may be asked to: Return on a different day to have your blood pressure checked again. Monitor your blood pressure at home for 1 week or longer. If you are diagnosed with hypertension, you may have other blood or imaging tests to help your health care provider understand your overall risk for other conditions. How is this treated? This condition is treated by making healthy lifestyle changes, such as eating healthy foods, exercising more, and reducing your alcohol intake. You may be referred for counseling on a healthy diet and physical activity. Your health care provider may prescribe medicine if lifestyle changes are not enough to get your blood pressure under control and if: Your systolic blood pressure is above 130. Your diastolic blood pressure is above 80. Your personal target blood pressure may vary depending on your medical conditions, your age, and other factors. Follow these instructions at home: Eating and drinking  Eat a diet that is high in fiber and potassium, and low in sodium, added sugar, and fat. An example of this eating plan is called the DASH diet. DASH stands for Dietary Approaches to Stop Hypertension. To eat this way: Eat   plenty of fresh fruits and vegetables. Try to fill one half of your plate at each meal with fruits and vegetables. Eat whole grains, such as whole-wheat pasta, brown rice, or whole-grain bread. Fill about one  fourth of your plate with whole grains. Eat or drink low-fat dairy products, such as skim milk or low-fat yogurt. Avoid fatty cuts of meat, processed or cured meats, and poultry with skin. Fill about one fourth of your plate with lean proteins, such as fish, chicken without skin, beans, eggs, or tofu. Avoid pre-made and processed foods. These tend to be higher in sodium, added sugar, and fat. Reduce your daily sodium intake. Many people with hypertension should eat less than 1,500 mg of sodium a day. Do not drink alcohol if: Your health care provider tells you not to drink. You are pregnant, may be pregnant, or are planning to become pregnant. If you drink alcohol: Limit how much you have to: 0-1 drink a day for women. 0-2 drinks a day for men. Know how much alcohol is in your drink. In the [place] one drink equals one 12 oz bottle of beer (355 mL), one 5 oz glass of wine (148 mL), or one 1 oz glass of hard liquor (44 mL). Lifestyle  Work with your health care provider to maintain a healthy body weight or to lose weight. Ask what an ideal weight is for you. Get at least 30 minutes of exercise that causes your heart to beat faster (aerobic exercise) most days of the week. Activities may include walking, swimming, or biking. Include exercise to strengthen your muscles (resistance exercise), such as Pilates or lifting weights, as part of your weekly exercise routine. Try to do these types of exercises for 30 minutes at least 3 days a week. Do not use any products that contain nicotine or tobacco. These products include cigarettes, chewing tobacco, and vaping devices, such as e-cigarettes. If you need help quitting, ask your health care provider. Monitor your blood pressure at home as told by your health care provider. Keep all follow-up visits. This is important. Medicines Take over-the-counter and prescription medicines only as told by your health care provider. Follow directions carefully. Blood  pressure medicines must be taken as prescribed. Do not skip doses of blood pressure medicine. Doing this puts you at risk for problems and can make the medicine less effective. Ask your health care provider about side effects or reactions to medicines that you should watch for. Contact a health care provider if you: Think you are having a reaction to a medicine you are taking. Have headaches that keep coming back (recurring). Feel dizzy. Have swelling in your ankles. Have trouble with your vision. Get help right away if you: Develop a severe headache or confusion. Have unusual weakness or numbness. Feel faint. Have severe pain in your chest or abdomen. Vomit repeatedly. Have trouble breathing. These symptoms may be an emergency. Get help right away. Call 911. Do not wait to see if the symptoms will go away. Do not drive yourself to the hospital. Summary Hypertension is when the force of blood pumping through your arteries is too strong. If this condition is not controlled, it may put you at risk for serious complications. Your personal target blood pressure may vary depending on your medical conditions, your age, and other factors. For most people, a normal blood pressure is less than 120/80. Hypertension is treated with lifestyle changes, medicines, or a combination of both. Lifestyle changes include losing weight, eating a healthy,   low-sodium diet, exercising more, and limiting alcohol. This information is not intended to replace advice given to you by your health care provider. Make sure you discuss any questions you have with your health care provider. Document Revised: 09/11/2021 Document Reviewed: 09/11/2021 Elsevier Patient Education  2023 [employer]  

## 2023-04-04 NOTE — ED Provider Triage Note (Signed)
Emergency Medicine Provider Triage Evaluation Note  Henry Miranda , a 42 y.o. male  was evaluated in triage.  Pt complains of hypertension, headache, chest pain.  Patient states that he has been with symptoms since he was seen emergency department last on 03/29/2023.  States he was sent home with valsartan which seems not been helping his blood pressure and although he reports compliance with blood pressure medications.  Reports intermittent headache usually beginning in the mornings and resolving as the day goes on for the past several days.  Denies any visual disturbance, gait abnormality, weakness/sensory deficits in upper or lower extremities, slurred speech, facial droop.  Does report substernal chest pain that has been present along with some associated shortness of breath..  Review of Systems  Positive: See abov Negative:   Physical Exam  BP (!) 200/132   Pulse 80   Temp 98.2 F (36.8 C) (Oral)   Resp 18   Ht 5\' 7"  (1.702 m)   Wt 105.7 kg   SpO2 100%   BMI 36.49 kg/m  Gen:   Awake, no distress   Resp:  Normal effort  MSK:   Moves extremities without difficulty  Other:  Cranial nerves III through XII grossly intact.  Medical Decision Making  Medically screening exam initiated at 2:01 PM.  Appropriate orders placed.  Lloyd Tolly was informed that the remainder of the evaluation will be completed by another provider, this initial triage assessment does not replace that evaluation, and the importance of remaining in the ED until their evaluation is complete.     Peter Garter, [place] 04/04/23 1402

## 2023-04-04 NOTE — ED Provider Notes (Signed)
[place] EMERGENCY DEPARTMENT AT [facility] Provider Note   CSN: [id number] Arrival date & time: 04/04/23  1322     History  Chief Complaint  Patient presents with   Hypertension    Henry Miranda is a 42 y.o. male.   Hypertension   42 year old male presents emergency department with complaints of hypertension, headache, chest pain. Patient states that he has been with symptoms since he was seen emergency department last on 03/29/2023. States he was sent home with valsartan which seems not been helping his blood pressure and although he reports compliance with blood pressure medications. Reports intermittent headache usually beginning in the mornings and resolving as the day goes on for the past several days. Denies any visual disturbance, gait abnormality, weakness/sensory deficits in upper or lower extremities, slurred speech, facial droop. Does report substernal chest pain that has been present along with some associated shortness of breath.   Past medical history significant for hypertension,  Home Medications Prior to Admission medications   Medication Sig Start Date End Date Taking? Authorizing Provider  hydrALAZINE (APRESOLINE) 10 MG tablet Take 1 tablet (10 mg total) by mouth 3 (three) times daily as needed (BP >190 systolic or symptomatic). 04/04/23  Yes Sherian Maroon A, PA  amoxicillin (AMOXIL) 875 MG tablet Take 1 tablet (875 mg total) by mouth 2 (two) times daily. 03/15/23   Wallis Bamberg, PA-C  baclofen (LIORESAL) 10 MG tablet Take 1 tablet (10 mg total) by mouth 3 (three) times daily. 11/28/22   Claiborne Rigg, NP  benzonatate (TESSALON) 200 MG capsule Take 1 capsule (200 mg total) by mouth 3 (three) times daily as needed for cough. 09/26/22   Coralyn Helling, MD  budesonide-formoterol (SYMBICORT) 80-4.5 MCG/ACT inhaler Inhale 2 puffs into the lungs 2 (two) times daily. 09/26/22   Coralyn Helling, MD  ipratropium-albuterol (DUONEB) 0.5-2.5 (3) MG/3ML SOLN Take 3 mLs  by nebulization every 6 (six) hours as needed.    [provider]  naproxen (NAPROSYN) 500 MG tablet Take 1 tablet (500 mg total) by mouth 2 (two) times daily with a meal. 11/28/22   Claiborne Rigg, NP  traMADol (ULTRAM) 50 MG tablet Take 1 tablet (50 mg total) by mouth at bedtime as needed (Cough). 09/26/22   Coralyn Helling, MD  valsartan (DIOVAN) 80 MG tablet Take 1 tablet (80 mg total) by mouth daily. 03/29/23 05/28/23  Rexford Maus, DO      Allergies    Pineapple    Review of Systems   Review of Systems  All other systems reviewed and are negative.   Physical Exam Updated Vital Signs BP (!) 167/96 (BP Location: Right Arm)   Pulse (!) 57   Temp 98.2 F (36.8 C) (Oral)   Resp 18   Ht 5\' 7"  (1.702 m)   Wt 105.7 kg   SpO2 97%   BMI 36.49 kg/m  Physical Exam Vitals and nursing note reviewed.  Constitutional:      General: He is not in acute distress.    Appearance: He is well-developed.  HENT:     Head: Normocephalic and atraumatic.  Eyes:     Conjunctiva/sclera: Conjunctivae normal.  Cardiovascular:     Rate and Rhythm: Normal rate and regular rhythm.     Heart sounds: No murmur heard. Pulmonary:     Effort: Pulmonary effort is normal. No respiratory distress.     Breath sounds: Normal breath sounds. No wheezing, rhonchi or rales.  Abdominal:     Palpations:  Abdomen is soft.     Tenderness: There is no abdominal tenderness. There is no guarding.  Musculoskeletal:        General: No swelling.     Cervical back: Neck supple.     Right lower leg: No edema.     Left lower leg: No edema.  Skin:    General: Skin is warm and dry.     Capillary Refill: Capillary refill takes less than 2 seconds.  Neurological:     Mental Status: He is alert.  Psychiatric:        Mood and Affect: Mood normal.     ED Results / Procedures / Treatments   Labs (all labs ordered are listed, but only abnormal results are displayed) Labs Reviewed  BASIC METABOLIC PANEL   CBC  TROPONIN I (HIGH SENSITIVITY)  TROPONIN I (HIGH SENSITIVITY)    EKG None  Radiology CT Head Wo Contrast  Result Date: 04/04/2023 CLINICAL DATA:  Hypertension and headache. EXAM: CT HEAD WITHOUT CONTRAST TECHNIQUE: Contiguous axial images were obtained from the base of the skull through the vertex without intravenous contrast. RADIATION DOSE REDUCTION: This exam was performed according to the departmental dose-optimization program which includes automated exposure control, adjustment of the mA and/or kV according to patient size and/or use of iterative reconstruction technique. COMPARISON:  None Available. FINDINGS: Brain: No evidence of acute infarction, hemorrhage, hydrocephalus, extra-axial collection or mass lesion/mass effect. Vascular: No hyperdense vessel or unexpected calcification. Skull: Normal. Negative for fracture or focal lesion. Sinuses/Orbits: No acute finding. Other: None. IMPRESSION: No acute intracranial pathology. Electronically Signed   By: Aram Candela M.D.   On: 04/04/2023 15:52   DG Chest 2 View  Result Date: 04/04/2023 CLINICAL DATA:  Chest pain EXAM: CHEST - 2 VIEW COMPARISON:  CXR 09/16/22 FINDINGS: The heart size and mediastinal contours are within normal limits. No pleural effusion. No pneumothorax. There is a hazy opacity in the right lung base which could represent atelectasis or infection. The visualized skeletal structures are unremarkable. IMPRESSION: Hazy opacity at the right lung base could represent atelectasis or infection. Electronically Signed   By: Lorenza Cambridge M.D.   On: 04/04/2023 14:06    Procedures Procedures    Medications Ordered in ED Medications  sodium chloride 0.9 % bolus 500 mL (0 mLs Intravenous Stopped 04/04/23 1523)  ketorolac (TORADOL) 15 MG/ML injection 15 mg (15 mg Intravenous Given 04/04/23 1446)  prochlorperazine (COMPAZINE) injection 10 mg (10 mg Intravenous Given 04/04/23 1447)  diphenhydrAMINE (BENADRYL) injection 25 mg  (25 mg Intravenous Given 04/04/23 1448)  hydrALAZINE (APRESOLINE) tablet 10 mg (10 mg Oral Given 04/04/23 1450)    ED Course/ Medical Decision Making/ A&P                             Medical Decision Making Amount and/or Complexity of Data Reviewed Labs: ordered. Radiology: ordered.  Risk Prescription drug management.   This patient presents to the ED for concern of hypertension, headache, chest pain, this involves an extensive number of treatment options, and is a complaint that carries with it a high risk of complications and morbidity.  The differential diagnosis includes ACS, PE, hypertensive emergency, hypertensive urgency, CVA   Co morbidities that complicate the patient evaluation  See HPI   Additional history obtained:  Additional history obtained from EMR External records from outside source obtained and reviewed including hospital records   Lab Tests:  I Ordered, and personally interpreted  labs.  The pertinent results include: No leukocytosis.  No evidence anemia.  This is normal range.  No electrolyte abnormalities.  No renal dysfunction.  Initial troponin of 6 with repeat 7   Imaging Studies ordered:  I ordered imaging studies including CT head, chest x-ray I independently visualized and interpreted imaging which showed  CT head: No acute intracranial abnormality Chest x-ray: No acute cardiopulmonary abnormalities.  Hazy opacity in right lower lung base. I agree with the radiologist interpretation   Cardiac Monitoring: / EKG:  The patient was maintained on a cardiac monitor.  I personally viewed and interpreted the cardiac monitored which showed an underlying rhythm of: Sinus rhythm without acute ischemic change   Consultations Obtained:  I requested consultation with attending physician Dr. Rush Landmark who is in agreement with treatment plan going forward  Problem List / ED Course / Critical interventions / Medication management  Hypertension, chest  pain, headache I ordered medication including 1 L normal saline, Compazine, Toradol, Benadryl, hydralazine Reevaluation of the patient after these medicines showed that the patient improved I have reviewed the patients home medicines and have made adjustments as needed   Social Determinants of Health:  Former cigarette use.  Denies illicit drug use.   Test / Admission - Considered:  Hypertension, chest pain, headache Vitals signs significant for initial hypertension with blood pressure of 223/142 oh which decreased her labs and medicines administered on emergency department 167/96. Otherwise within normal range and stable throughout visit. Laboratory/imaging studies significant for: See above 42 year old male presents with continued hypertension, headache and chest pain.  Patient's hypertension seem to be refractory to valsartan prescribed approximately 1 week ago.  Patient seems to have had chronic hypertension only recently checked within the past 7 to 10 days noting elevation.  No evidence of AKI, elevation in troponins, acute neurologic deficit or imaging abnormality on CT head.  Patient's hypertension seems to not be causing acute endorgan damage currently.  Patient did note significant improvement of symptoms of headache as well as chest pain with administration of migraine cocktail.  Patient blood pressure improved with administration of hydralazine.  Will recommend continue use of hydralazine at home for blood pressures above 190 systolic with continued use of valsartan at baseline.  Patient has upcoming appointment already with primary care for further blood pressure assessment.  Further workup deemed unnecessary at this time while emergency department.  Treatment plan discussed at length with patient and he acknowledged understanding was agreeable to said plan.  Patient overall well-appearing, afebrile in no acute distress. Worrisome signs and symptoms were discussed with the patient, and  the patient acknowledged understanding to return to the ED if noticed. Patient was stable upon discharge.          Final Clinical Impression(s) / ED Diagnoses Final diagnoses:  Hypertension, unspecified type    Rx / DC Orders ED Discharge Orders          Ordered    hydrALAZINE (APRESOLINE) 10 MG tablet  3 times daily PRN        04/04/23 1628              Peter Garter, [place] 04/04/23 2224    Tegeler, Canary Brim, MD 04/05/23 [phone]

## 2023-04-04 NOTE — Discharge Instructions (Signed)
As discussed, recommend additionally taking hydralazine 10 mg up to 3 times daily as needed for blood pressure readings greater than 190 systolic or if you become symptomatic again.  As we discussed, we do not want to normalize your blood pressure abruptly as this can cause adverse side effects.  Keep upcoming appointment with primary care as they can further manage your blood pressure.  Please do not hesitate to return to emergency department for worrisome signs and symptoms we discussed become apparent.

## 2023-04-04 NOTE — Progress Notes (Signed)
Virtual Visit Consent   Henry Miranda, you are scheduled for a virtual visit with a Henry Miranda provider today. Just as with appointments in the office, your consent must be obtained to participate. Your consent will be active for this visit and any virtual visit you may have with one of our providers in the next 365 days. If you have a MyChart account, a copy of this consent can be sent to you electronically.  As this is a virtual visit, video technology does not allow for your provider to perform a traditional examination. This may limit your provider's ability to fully assess your condition. If your provider identifies any concerns that need to be evaluated in person or the need to arrange testing (such as labs, EKG, etc.), we will make arrangements to do so. Although advances in technology are sophisticated, we cannot ensure that it will always work on either your end or our end. If the connection with a video visit is poor, the visit may have to be switched to a telephone visit. With either a video or telephone visit, we are not always able to ensure that we have a secure connection.  By engaging in this virtual visit, you consent to the provision of healthcare and authorize for your insurance to be billed (if applicable) for the services provided during this visit. Depending on your insurance coverage, you may receive a charge related to this service.  I need to obtain your verbal consent now. Are you willing to proceed with your visit today? Henry Miranda has provided verbal consent on 04/04/2023 for a virtual visit (video or telephone). Henry Curio, FNP  Date: 04/04/2023 8:40 AM  Virtual Visit via Video Note   I, Henry Miranda, connected with  Henry Miranda  ([id number], Mar 01, 1981) on 04/04/23 at  8:30 AM EDT by a video-enabled telemedicine application and verified that I am speaking with the correct person using two identifiers.  Location: Patient: Virtual Visit Location Patient:  Home Provider: Virtual Visit Location Provider: Office/Clinic   I discussed the limitations of evaluation and management by telemedicine and the availability of in person appointments. The patient expressed understanding and agreed to proceed.    History of Present Illness: Henry Miranda is a 42 y.o. 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 His BP has been 200's over 120's -140's. He was seen at the ED and started on valsartan with not much improvement in BP. He had chest pain during hte night, headaches and dizziness. He would like referral to cardiology. He has a pcp but says they have changed doctors 5 times and he's not sure who his new one is. 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   History of social smoking for 19 years- 1 cig q 3-d 01/20/2018   BMI 35.0-35.9,adult 01/20/2018   Family history of cancer 01/20/2018    Allergies:  Allergies  Allergen Reactions   Pineapple    Medications:  Current Outpatient Medications:    amoxicillin (AMOXIL) 875 MG tablet, Take 1 tablet (875 mg total) by mouth 2 (two) times daily., Disp: 20 tablet, Rfl: 0   baclofen (LIORESAL) 10 MG tablet, Take 1 tablet (10 mg total) by mouth 3 (three) times daily., Disp: 30 each, Rfl: 0  benzonatate (TESSALON) 200 MG capsule, Take 1 capsule (200 mg total) by mouth 3 (three) times daily as needed for cough., Disp: 30 capsule, Rfl: 1   budesonide-formoterol (SYMBICORT) 80-4.5 MCG/ACT inhaler, Inhale 2 puffs into the lungs 2 (two) times daily., Disp: 1 each, Rfl: 12   ipratropium-albuterol (DUONEB) 0.5-2.5 (3) MG/3ML SOLN, Take 3 mLs by nebulization every 6 (six) hours as needed., Disp: , Rfl:    naproxen (NAPROSYN) 500 MG tablet, Take 1 tablet (500 mg total) by mouth 2 (two) times  daily with a meal., Disp: 30 tablet, Rfl: 0   traMADol (ULTRAM) 50 MG tablet, Take 1 tablet (50 mg total) by mouth at bedtime as needed (Cough)., Disp: 10 tablet, Rfl: 0   valsartan (DIOVAN) 80 MG tablet, Take 1 tablet (80 mg total) by mouth daily., Disp: 30 tablet, Rfl: 1  Observations/Objective:Unable to visualize patient. Talked due to video malfunction.  Assessment and Plan: 1. HTN (hypertension), malignant  Advised to go back to ED as BP is placing him at high risk for MI or stroke. He agrees.   Follow Up Instructions: I discussed the assessment and treatment plan with the patient. The patient was provided an opportunity to ask questions and all were answered. The patient agreed with the plan and demonstrated an understanding of the instructions.  A copy of instructions were sent to the patient via MyChart unless otherwise noted below.     The patient was advised to call back or seek an in-person evaluation if the symptoms worsen or if the condition fails to improve as anticipated.  Time:  I spent 10 minutes with the patient via telehealth technology discussing the above problems/concerns.    Henry Curio, FNP

## 2023-04-04 NOTE — ED Triage Notes (Addendum)
Patient said he took his blood pressure and it was 213/124 today. Feels like his head is going to explode. Centralized chest pain, no radiation. Started Valsartan once a day, a week ago.

## 2023-04-09 ENCOUNTER — Ambulatory Visit: Payer: Managed Care, Other (non HMO) | Admitting: Family Medicine

## 2023-04-09 ENCOUNTER — Encounter: Payer: Self-pay | Admitting: Family Medicine

## 2023-04-09 VITALS — BP 142/93 | HR 70 | Temp 98.3°F | Ht 67.0 in | Wt 238.0 lb

## 2023-04-09 DIAGNOSIS — I10 Essential (primary) hypertension: Secondary | ICD-10-CM | POA: Diagnosis not present

## 2023-04-09 MED ORDER — HYDROCHLOROTHIAZIDE 25 MG PO TABS
25.0000 mg | ORAL_TABLET | Freq: Every day | ORAL | 1 refills | Status: DC
Start: 2023-04-09 — End: 2023-05-09

## 2023-04-09 NOTE — Progress Notes (Signed)
   Established Patient Office Visit  Subjective   Patient ID: Henry Miranda, male    DOB: 1981/07/11  Age: 42 y.o. MRN: [id number]  Chief Complaint  Patient presents with   Follow-up    HPI   First prescribed bp meds on the 11th.  Has been told before blood pressure was a little high but never prescirbed meds prior.  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 No increased stress recently that he can recall.  Also has  a tooth issue which caused pain in his jaw due to crack in his molar.  Even after the valsartan prescribed on the 11th it was elevated over 200.  developed headache and chest pain and went to the ED again on the 17th and again had bp over 200.  Added the hydralazine PRN after that.  Has taken the hydralazine twice since then but typically has BP levels in the 180s or higher since the 17th.   Mother has HTN.  No family hx of kidney disease.  Feels tired while on the medication but still able to jog 6 miles on Sunday.     ROS    Objective:     BP (!) 142/93   Pulse 70   Temp 98.3 F (36.8 C) (Oral)   Ht 5\' 7"  (1.702 m)   Wt 238 lb (108 kg)   SpO2 98%   BMI 37.28 kg/m    Physical Exam Gen: alert, oriented Cv: rrr Pulm: lctab Ext: no edema  No results found for any visits on 04/09/23.    The ASCVD Risk score (Arnett DK, et al., 2019) failed to calculate for the following reasons:   Cannot find a previous HDL lab   Cannot find a previous total cholesterol lab    Assessment & Plan:   Problem List Items Addressed This Visit       Cardiovascular and Mediastinum   Severe hypertension - Primary    Multiple readings over 180 systolic and over 100 diastolic.  Better today in the clinic but still elevated.  Was started on valsartan.  Adding hydrochlorothiazide today.  Rechecking BMP, checking renin and aldosterone ratio and ordering ultrasound for renal artery stenosis.  Will recheck BMP and blood pressure again in 1 to 2 weeks at nurse visit  and then follow back up with me in approximately 1 month.  Next would add amlodipine.  Can then combine into combination pill.      Relevant Medications   hydrochlorothiazide (HYDRODIURIL) 25 MG tablet   Other Relevant Orders   Aldosterone + renin activity w/ ratio   US Renal Artery Stenosis   Basic Metabolic Panel (BMET)   Basic Metabolic Panel (BMET)    Return in about 4 weeks (around 05/07/2023) for HTN.    Sandre Kitty, MD

## 2023-04-09 NOTE — Patient Instructions (Signed)
It was nice to meet you today,  I have added hydrochlorothiazide to your medication.  Take this once a day as well in addition to your valsartan.  If you tolerate this medication we can then combine the 2 medications to 1 pill.  I would like you to come back in 1 week for a nurse visit so we can check your blood pressure again and check your kidney function.  After that would like to see you back in 1 month with me.  Have a great day,  Frederic Jericho, MD

## 2023-04-09 NOTE — Assessment & Plan Note (Signed)
Multiple readings over 180 systolic and over 100 diastolic.  Better today in the clinic but still elevated.  Was started on valsartan.  Adding hydrochlorothiazide today.  Rechecking BMP, checking renin and aldosterone ratio and ordering ultrasound for renal artery stenosis.  Will recheck BMP and blood pressure again in 1 to 2 weeks at nurse visit and then follow back up with me in approximately 1 month.  Next would add amlodipine.  Can then combine into combination pill.

## 2023-04-10 LAB — BASIC METABOLIC PANEL: eGFR: 89 mL/min/{1.73_m2} (ref >59)

## 2023-04-10 LAB — ALDOSTERONE + RENIN ACTIVITY W/ RATIO

## 2023-04-13 LAB — BASIC METABOLIC PANEL
BUN: 21 mg/dL (ref 6–24)
CO2: 23 mmol/L (ref 20–29)
Chloride: 102 mmol/L (ref 96–106)

## 2023-04-15 LAB — BASIC METABOLIC PANEL
BUN/Creatinine Ratio: 20 (ref 9–20)
Calcium: 10.2 mg/dL (ref 8.7–10.2)
Creatinine, Ser: 1.07 mg/dL (ref 0.76–1.27)
Glucose: 89 mg/dL (ref 70–99)
Potassium: 4.5 mmol/L (ref 3.5–5.2)
Sodium: 139 mmol/L (ref 134–144)

## 2023-04-15 LAB — ALDOSTERONE + RENIN ACTIVITY W/ RATIO
Aldosterone: 15 ng/dL (ref 0.0–30.0)
Renin Activity, Plasma: 5.319 ng/mL/hr (ref 0.167–5.380)

## 2023-04-25 ENCOUNTER — Other Ambulatory Visit: Payer: Managed Care, Other (non HMO)

## 2023-04-25 ENCOUNTER — Ambulatory Visit (INDEPENDENT_AMBULATORY_CARE_PROVIDER_SITE_OTHER): Payer: Managed Care, Other (non HMO) | Admitting: Nurse Practitioner

## 2023-04-25 DIAGNOSIS — I10 Essential (primary) hypertension: Secondary | ICD-10-CM | POA: Diagnosis not present

## 2023-04-25 NOTE — Progress Notes (Signed)
Patient is here for a recheck of his blood pressure 

## 2023-04-26 LAB — BASIC METABOLIC PANEL
BUN/Creatinine Ratio: 12 (ref 9–20)
BUN: 15 mg/dL (ref 6–24)
CO2: 27 mmol/L (ref 20–29)
Calcium: 9.6 mg/dL (ref 8.7–10.2)
Chloride: 101 mmol/L (ref 96–106)
Creatinine, Ser: 1.25 mg/dL (ref 0.76–1.27)
Glucose: 123 mg/dL — ABNORMAL HIGH (ref 70–99)
Potassium: 3.7 mmol/L (ref 3.5–5.2)
Sodium: 141 mmol/L (ref 134–144)
eGFR: 74 mL/min/{1.73_m2} (ref >59)

## 2023-05-09 ENCOUNTER — Encounter: Payer: Self-pay | Admitting: Family Medicine

## 2023-05-09 ENCOUNTER — Ambulatory Visit (INDEPENDENT_AMBULATORY_CARE_PROVIDER_SITE_OTHER): Payer: Managed Care, Other (non HMO) | Admitting: Family Medicine

## 2023-05-09 VITALS — BP 133/89 | HR 67 | Temp 97.8°F | Ht 67.0 in | Wt 242.0 lb

## 2023-05-09 DIAGNOSIS — Z6835 Body mass index (BMI) 35.0-35.9, adult: Secondary | ICD-10-CM

## 2023-05-09 DIAGNOSIS — Z6837 Body mass index (BMI) 37.0-37.9, adult: Secondary | ICD-10-CM

## 2023-05-09 DIAGNOSIS — E6609 Other obesity due to excess calories: Secondary | ICD-10-CM | POA: Diagnosis not present

## 2023-05-09 DIAGNOSIS — I10 Essential (primary) hypertension: Secondary | ICD-10-CM

## 2023-05-09 MED ORDER — VALSARTAN 80 MG PO TABS: 80.0000 mg | ORAL_TABLET | Freq: Every day | ORAL | 3 refills | Status: DC

## 2023-05-09 MED ORDER — HYDROCHLOROTHIAZIDE 25 MG PO TABS: 25.0000 mg | ORAL_TABLET | Freq: Every day | ORAL | 3 refills | Status: DC

## 2023-05-09 NOTE — Assessment & Plan Note (Signed)
Continue valsartan and hydrochlorothiazide.

## 2023-05-09 NOTE — Progress Notes (Signed)
   Established Patient Office Visit  Subjective   Patient ID: Henry Miranda, male    DOB: 11-Dec-1980  Age: 42 y.o. MRN: [id number]  Chief Complaint  Patient presents with   Follow-up    htn    HPI Htn - pt doing well on current medication.  Discussed switching to combo pill.  Pt would prefer to stick with current mgmt because 'it's working'.    Discussed A1c and hld testing for his age and weight.  Pt endorses healthy diet of egg whites, chicken, salads.  Advised him to contact his health insurance company to see if semaglutide for weight mgmt is covered.   The ASCVD Risk score (Arnett DK, et al., 2019) failed to calculate for the following reasons:   Cannot find a previous HDL lab   Cannot find a previous total cholesterol lab     ROS    Objective:     BP 133/89   Pulse 67   Temp 97.8 F (36.6 C) (Oral)   Ht 5\' 7"  (1.702 m)   Wt 242 lb (109.8 kg)   SpO2 97%   BMI 37.90 kg/m    Physical Exam Gen: alert, oriented Pulm: no resp distress Psych: pleasant affect  No results found for any visits on 05/09/23.        Assessment & Plan:   Class 2 obesity due to excess calories with body mass index (BMI) of 37.0 to 37.9 in adult, unspecified whether serious comorbidity present -     Hemoglobin A1c -     Lipid panel  Primary hypertension Assessment & Plan: Continue valsartan and hydrochlorothiazide.      BMI 35.0-35.9,adult Assessment & Plan: A1c, lipid.  Pt to call his insurance regarding semaglutide for obesity   Other orders -     hydroCHLOROthiazide; Take 1 tablet (25 mg total) by mouth daily.  Dispense: 90 tablet; Refill: 3 -     Valsartan; Take 1 tablet (80 mg total) by mouth daily.  Dispense: 90 tablet; Refill: 3     Return in about 3 months (around 08/09/2023) for HTN.    Sandre Kitty, MD

## 2023-05-09 NOTE — Assessment & Plan Note (Signed)
A1c, lipid.  Pt to call his insurance regarding semaglutide for obesity

## 2023-05-09 NOTE — Patient Instructions (Addendum)
It was nice to see you today,  We addressed the following topics today: - we refilled your medication for blood pressure - I will contact you for the results of your A1c and lipid panel tests.  - ask your insurance company if they cover semaglutide for weight management.  If they do we can discuss prescribing this medication.   Have a great day,  Frederic Jericho, MD

## 2023-05-10 LAB — LIPID PANEL
Chol/HDL Ratio: 3.6 ratio (ref 0.0–5.0)
Cholesterol, Total: 184 mg/dL (ref 100–199)
HDL: 51 mg/dL (ref >39)
LDL Chol Calc (NIH): 103 mg/dL — ABNORMAL HIGH (ref 0–99)
Triglycerides: 170 mg/dL — ABNORMAL HIGH (ref 0–149)
VLDL Cholesterol Cal: 30 mg/dL (ref 5–40)

## 2023-05-10 LAB — HEMOGLOBIN A1C
Est. average glucose Bld gHb Est-mCnc: 114 mg/dL
Hgb A1c MFr Bld: 5.6 % (ref 4.8–5.6)

## 2023-05-12 ENCOUNTER — Other Ambulatory Visit: Payer: Self-pay | Admitting: Family Medicine

## 2023-05-12 MED ORDER — WEGOVY 0.25 MG/0.5ML ~~LOC~~ SOAJ: 0.2500 mg | SUBCUTANEOUS | 1 refills | Status: DC

## 2023-05-19 ENCOUNTER — Ambulatory Visit
Admission: RE | Admit: 2023-05-19 | Discharge: 2023-05-19 | Disposition: A | Discharge status: Home / Self Care | Payer: Managed Care, Other (non HMO) | Source: Ambulatory Visit | Attending: Family Medicine

## 2023-05-19 DIAGNOSIS — I10 Essential (primary) hypertension: Secondary | ICD-10-CM

## 2023-10-30 ENCOUNTER — Telehealth: Payer: Managed Care, Other (non HMO) | Admitting: Physician Assistant

## 2023-10-30 DIAGNOSIS — J208 Acute bronchitis due to other specified organisms: Secondary | ICD-10-CM | POA: Diagnosis not present

## 2023-10-30 MED ORDER — BENZONATATE 100 MG PO CAPS
100.0000 mg | ORAL_CAPSULE | Freq: Three times a day (TID) | ORAL | 0 refills | Status: DC | PRN
Start: 2023-10-30 — End: 2024-08-03

## 2023-10-30 MED ORDER — AZITHROMYCIN 250 MG PO TABS: ORAL_TABLET | ORAL | 0 refills | Status: AC

## 2023-10-30 MED ORDER — AZITHROMYCIN 1 % OP SOLN: OPHTHALMIC | 0 refills | Status: DC

## 2023-10-30 NOTE — Progress Notes (Signed)
I have spent 5 minutes in review of e-visit questionnaire, review and updating patient chart, medical decision making and response to patient.   Mia Milan Cody Jacklynn Dehaas, PA-C    

## 2023-10-30 NOTE — Addendum Note (Signed)
Addended by: Waldon Merl on: 10/30/2023 05:05 PM   Modules accepted: Orders

## 2023-10-30 NOTE — Progress Notes (Signed)
E-Visit for Cough  We are sorry that you are not feeling well.  Here is how we plan to help!  Based on your presentation I believe you most likely have A cough due to a virus.  This is called viral bronchitis and is best treated by rest, plenty of fluids and control of the cough.  You may use Ibuprofen or Tylenol as directed to help your symptoms.     In addition you may use A prescription cough medication called Tessalon Perles 100mg . You may take 1-2 capsules every 8 hours as needed for your cough.   From your responses in the eVisit questionnaire you describe inflammation in the upper respiratory tract which is causing a significant cough.  This is commonly called Bronchitis and has four common causes:   Allergies Viral Infections Acid Reflux Bacterial Infection Allergies, viruses and acid reflux are treated by controlling symptoms or eliminating the cause. An example might be a cough caused by taking certain blood pressure medications. You stop the cough by changing the medication. Another example might be a cough caused by acid reflux. Controlling the reflux helps control the cough.  USE OF BRONCHODILATOR ("RESCUE") INHALERS: There is a risk from using your bronchodilator too frequently.  The risk is that over-reliance on a medication which only relaxes the muscles surrounding the breathing tubes can reduce the effectiveness of medications prescribed to reduce swelling and congestion of the tubes themselves.  Although you feel brief relief from the bronchodilator inhaler, your asthma may actually be worsening with the tubes becoming more swollen and filled with mucus.  This can delay other crucial treatments, such as oral steroid medications. If you need to use a bronchodilator inhaler daily, several times per day, you should discuss this with your provider.  There are probably better treatments that could be used to keep your asthma under control.     HOME CARE Only take medications as  instructed by your medical team. Complete the entire course of an antibiotic. Drink plenty of fluids and get plenty of rest. Avoid close contacts especially the very young and the elderly Cover your mouth if you cough or cough into your sleeve. Always remember to wash your hands A steam or ultrasonic humidifier can help congestion.   GET HELP RIGHT AWAY IF: You develop worsening fever. You become short of breath You cough up blood. Your symptoms persist after you have completed your treatment plan MAKE SURE YOU  Understand these instructions. Will watch your condition. Will get help right away if you are not doing well or get worse.    Thank you for choosing an e-visit.  Your e-visit answers were reviewed by a board certified advanced clinical practitioner to complete your personal care plan. Depending upon the condition, your plan could have included both over the counter or prescription medications.  Please review your pharmacy choice. Make sure the pharmacy is open so you can pick up prescription now. If there is a problem, you may contact your provider through [employer] and have the prescription routed to another pharmacy.  Your safety is important to [place]. If you have drug allergies check your prescription carefully.   For the next 24 hours you can use MyChart to ask questions about today's visit, request a non-urgent call back, or ask for a work or school excuse. You will get an email in the next two days asking about your experience. I hope that your e-visit has been valuable and will speed your recovery.

## 2024-04-04 ENCOUNTER — Telehealth

## 2024-04-04 DIAGNOSIS — S50869A Insect bite (nonvenomous) of unspecified forearm, initial encounter: Secondary | ICD-10-CM | POA: Diagnosis not present

## 2024-04-04 DIAGNOSIS — W57XXXA Bitten or stung by nonvenomous insect and other nonvenomous arthropods, initial encounter: Secondary | ICD-10-CM | POA: Diagnosis not present

## 2024-04-04 DIAGNOSIS — L0390 Cellulitis, unspecified: Secondary | ICD-10-CM | POA: Diagnosis not present

## 2024-04-05 MED ORDER — CEPHALEXIN 500 MG PO CAPS
500.0000 mg | ORAL_CAPSULE | Freq: Four times a day (QID) | ORAL | 0 refills | Status: DC
Start: 2024-04-05 — End: 2024-08-03

## 2024-04-05 NOTE — Progress Notes (Signed)
 E-Visit for Insect Sting  Thank you for describing the insect sting for us .  Here is how we plan to help!  Based on the information you have shared you most likely have:  An uncomplicated insect sting/bite that just occurred and can be closely followed using the instructions in your care plan.  The 2 greatest risks from insect stings are allergic reaction, which can be fatal in some people and infection, which is more common and less serious.  Bees, wasps, yellow jackets, and hornets belong to a class of insects called Hymenoptera.  Most insect stings cause only minor discomfort.  Stings can happen anywhere on the body and can be painful.  Most stings are from honey bees or yellow jackets.  Fire ants can sting multiple times.  The sites of the stings are more likely to become infected.    Based on your information I have: and Provided a home care guide for insect stings and instructions on when to call for help.  I have prescribed: Keflex  500mg  Take 1 capsule four times daily for 5 days.  What can be used to prevent Insect Stings?  Insect repellant with at least 20% DEET.  Wearing long pants and shirts with socks and shoes.  Wear dark or drab-colored clothes rather than bright colors.  Avoid using perfumes and hair sprays; these attract insects.  HOME CARE ADVICE:  1. Stinger removal: The stinger looks like a tiny black dot in the sting. Use a fingernail, credit card edge, or knife-edge to scrape it off.  Don't pull it out because it squeezes out more venom. If the stinger is below the skin surface, leave it alone.  It will be shed with normal skin healing. 2. Use cold compresses to the area of the sting for 10-20 minutes.  You may repeat this as needed to relieve symptoms of pain and swelling. 3.  For pain relief, take acetominophen 650 mg 4-6 hours as needed or ibuprofen  400 mg every 6-8 hours as needed or naproxen  250-500 mg every 12 hours as needed. 4.  You can also use  hydrocortisone cream 0.5% or 1% up to 4 times daily as needed for itching. 5.  If the sting becomes very itchy, take Benadryl  25-50 mg, follow directions on box. 6.  Wash the area 2-3 times daily with antibacterial soap and warm water. 7. Call your Doctor if: Fever, a severe headache, or rash occur in the next 2 weeks. Sting area begins to look infected. Redness and swelling worsens after home treatment. Your current symptoms become worse.    MAKE SURE YOU:  Understand these instructions. Will watch your condition. Will get help right away if you are not doing well or get worse.  Thank you for choosing an e-visit.  Your e-visit answers were reviewed by a board certified advanced clinical practitioner to complete your personal care plan. Depending upon the condition, your plan could have included both over the counter or prescription medications.  Please review your pharmacy choice. Make sure the pharmacy is open so you can pick up prescription now. If there is a problem, you may contact your provider through [employer] and have the prescription routed to another pharmacy.  Your safety is important to us . If you have drug allergies check your prescription carefully.   For the next 24 hours you can use MyChart to ask questions about today's visit, request a non-urgent call back, or ask for a work or school excuse. You will get an email  in the next two days asking about your experience. I hope that your e-visit has been valuable and will speed your recovery.    I have spent 5 minutes in review of e-visit questionnaire, review and updating patient chart, medical decision making and response to patient.   Angelia Kelp, PA-C

## 2024-05-12 ENCOUNTER — Other Ambulatory Visit: Payer: Self-pay | Admitting: Family Medicine

## 2024-07-16 ENCOUNTER — Encounter: Payer: Self-pay | Admitting: Family Medicine

## 2024-07-27 ENCOUNTER — Encounter: Payer: Self-pay | Admitting: Family Medicine

## 2024-07-28 ENCOUNTER — Other Ambulatory Visit: Payer: Self-pay | Admitting: Family Medicine

## 2024-07-28 MED ORDER — WEGOVY 0.25 MG/0.5ML ~~LOC~~ SOAJ: 0.2500 mg | SUBCUTANEOUS | 1 refills | Status: DC

## 2024-07-28 NOTE — Telephone Encounter (Signed)
[place] I resent it

## 2024-07-29 ENCOUNTER — Other Ambulatory Visit: Payer: Self-pay | Admitting: *Deleted

## 2024-07-29 DIAGNOSIS — E66812 Other obesity due to excess calories: Secondary | ICD-10-CM

## 2024-07-29 DIAGNOSIS — I10 Essential (primary) hypertension: Secondary | ICD-10-CM

## 2024-07-30 ENCOUNTER — Other Ambulatory Visit

## 2024-07-30 DIAGNOSIS — I10 Essential (primary) hypertension: Secondary | ICD-10-CM

## 2024-07-30 DIAGNOSIS — E66812 Obesity, class 2: Secondary | ICD-10-CM

## 2024-07-31 LAB — TSH: TSH: 1.74 u[IU]/mL (ref 0.450–4.500)

## 2024-07-31 LAB — CBC WITH DIFFERENTIAL/PLATELET
Basophils Absolute: 0.1 x10E3/uL (ref 0.0–0.2)
Basos: 1 %
EOS (ABSOLUTE): 0.1 x10E3/uL (ref 0.0–0.4)
Eos: 2 %
Hematocrit: 54.6 % — ABNORMAL HIGH (ref 37.5–51.0)
Hemoglobin: 17.7 g/dL (ref 13.0–17.7)
Immature Grans (Abs): 0 x10E3/uL (ref 0.0–0.1)
Immature Granulocytes: 0 %
Lymphocytes Absolute: 2 x10E3/uL (ref 0.7–3.1)
Lymphs: 33 %
MCH: 30.7 pg (ref 26.6–33.0)
MCHC: 32.4 g/dL (ref 31.5–35.7)
MCV: 95 fL (ref 79–97)
Monocytes Absolute: 0.6 x10E3/uL (ref 0.1–0.9)
Monocytes: 10 %
Neutrophils Absolute: 3.3 x10E3/uL (ref 1.4–7.0)
Neutrophils: 54 %
Platelets: 266 x10E3/uL (ref 150–450)
RBC: 5.77 x10E6/uL (ref 4.14–5.80)
RDW: 13.7 % (ref 11.6–15.4)
WBC: 6.1 x10E3/uL (ref 3.4–10.8)

## 2024-07-31 LAB — LIPID PANEL
Chol/HDL Ratio: 3.8 ratio (ref 0.0–5.0)
Cholesterol, Total: 224 mg/dL — ABNORMAL HIGH (ref 100–199)
HDL: 59 mg/dL (ref >39)
LDL Chol Calc (NIH): 150 mg/dL — ABNORMAL HIGH (ref 0–99)
Triglycerides: 85 mg/dL (ref 0–149)
VLDL Cholesterol Cal: 15 mg/dL (ref 5–40)

## 2024-07-31 LAB — COMPREHENSIVE METABOLIC PANEL WITH GFR
ALT: 45 IU/L — ABNORMAL HIGH (ref 0–44)
AST: 34 IU/L (ref 0–40)
Albumin: 4.9 g/dL (ref 4.1–5.1)
Alkaline Phosphatase: 74 IU/L (ref 44–121)
BUN/Creatinine Ratio: 12 (ref 9–20)
BUN: 14 mg/dL (ref 6–24)
Bilirubin Total: 1.8 mg/dL — ABNORMAL HIGH (ref 0.0–1.2)
CO2: 24 mmol/L (ref 20–29)
Calcium: 9.7 mg/dL (ref 8.7–10.2)
Chloride: 97 mmol/L (ref 96–106)
Creatinine, Ser: 1.19 mg/dL (ref 0.76–1.27)
Globulin, Total: 2.7 g/dL (ref 1.5–4.5)
Glucose: 86 mg/dL (ref 70–99)
Potassium: 3.8 mmol/L (ref 3.5–5.2)
Sodium: 139 mmol/L (ref 134–144)
Total Protein: 7.6 g/dL (ref 6.0–8.5)
eGFR: 77 mL/min/1.73 (ref >59)

## 2024-07-31 LAB — HEMOGLOBIN A1C
Est. average glucose Bld gHb Est-mCnc: 111 mg/dL
Hgb A1c MFr Bld: 5.5 % (ref 4.8–5.6)

## 2024-08-02 ENCOUNTER — Ambulatory Visit: Payer: Self-pay | Admitting: Family Medicine

## 2024-08-03 ENCOUNTER — Encounter: Payer: Self-pay | Admitting: Family Medicine

## 2024-08-03 ENCOUNTER — Ambulatory Visit (INDEPENDENT_AMBULATORY_CARE_PROVIDER_SITE_OTHER): Admitting: Family Medicine

## 2024-08-03 VITALS — BP 152/96 | HR 62 | Ht 67.0 in | Wt 228.1 lb

## 2024-08-03 DIAGNOSIS — Z0000 Encounter for general adult medical examination without abnormal findings: Secondary | ICD-10-CM

## 2024-08-03 DIAGNOSIS — I10 Essential (primary) hypertension: Secondary | ICD-10-CM

## 2024-08-03 DIAGNOSIS — M7040 Prepatellar bursitis, unspecified knee: Secondary | ICD-10-CM | POA: Insufficient documentation

## 2024-08-03 DIAGNOSIS — Z6835 Body mass index (BMI) 35.0-35.9, adult: Secondary | ICD-10-CM | POA: Diagnosis not present

## 2024-08-03 DIAGNOSIS — M7041 Prepatellar bursitis, right knee: Secondary | ICD-10-CM

## 2024-08-03 DIAGNOSIS — K625 Hemorrhage of anus and rectum: Secondary | ICD-10-CM | POA: Insufficient documentation

## 2024-08-03 MED ORDER — VALSARTAN 160 MG PO TABS: 160.0000 mg | ORAL_TABLET | Freq: Every day | ORAL | 3 refills | Status: AC

## 2024-08-03 NOTE — Assessment & Plan Note (Signed)
 Blood pressure remains elevated at 146/104 despite medication. Goal BP is <140/90, ideally <130/80. - Will increase valsartan  dose. Current dose is not at maximum. - Instructed to take two of the current strength pills until the new prescription is filled. - Amlodipine combination pill is not covered by insurance.

## 2024-08-03 NOTE — Progress Notes (Unsigned)
 Annual physical  Subjective   Patient ID: Henry Miranda, male    DOB: 1981/02/28  Age: 43 y.o. MRN: [id number]  Chief Complaint  Patient presents with   Annual Exam   HPI Henry Miranda is a 43 y.o. old male here  for annual exam.   Subjective - Right knee swelling, started yesterday. Describes as fluid buildup around the kneecap. Minimal pain, rated 0.5-1/10. No history of prior swelling. No significant kneeling. Denies redness or warmth.  - Rectal bleeding noted twice. Noticed blood on underwear and pants. No pain with bleeding, but reports a chafing sensation with walking. No pain with bowel movements. Denies history of hemorrhoids or bleeding with BMs.  - Difficulty with weight loss. Current weight 228.12 lbs, down from 243 lbs in January. Previous low weight of 175 lbs during COVID, but regained. Currently follows a 1660 calorie/day diet tracked via [employer] It app and averages 12,000 steps/day. Diet consists of home-prepared, non-processed foods (eggs, prosciutto, sheep's milk cheese, homemade bread). Drinks Coke Zero, no liquid calories. Alcohol intake is 2 measured bourbons on Friday and 2 over the weekend. Also lifts weights 3x/week with 50 lb dumbbells and a 74 kg kettlebell, including squats. Has a gym membership. Recently gained 10 lbs in the last month despite tracking intake and activity.  Medications Current medications include hydrochlorothiazide  and valsartan . Hydralazine  is available for hypertensive emergencies, used only once. Reports insurance is no longer covering valsartan /HCTZ. Plans to pay out-of-pocket for Wegovy  or Zepbound for weight loss if prior authorization for Wegovy  is denied.  PMH, PSH, FH, Social Hx PMHx: Hypertension, high cholesterol. PSH: None. FH: Pancreatic cancer, brain tumor, skin cancer, lung cancer. No family history of colon cancer. Social Hx: Drinks 4 alcoholic beverages per week (bourbon), typically on weekends. Does not eat processed foods.  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  ROS Constitutional: Reports weight gain despite diet and exercise. GI: Reports painless bright red rectal bleeding. No pain with bowel movements. MSK: Reports right knee swelling and minimal pain.    The 10-year ASCVD risk score (Arnett DK, et al., 2019) is: 2.8%  Health Maintenance Due  Topic Date Due   HIV Screening  Never done   Hepatitis C Screening  Never done   Pneumococcal Vaccine (1 of 2 - PCV) Never done   Hepatitis B Vaccines 19-59 Average Risk (1 of 3 - 19+ 3-dose series) Never done   HPV VACCINES (1 - 3-dose SCDM series) Never done   COVID-19 Vaccine (3 - 2025-26 season) 07/19/2024      Objective:     BP (!) 152/96   Pulse 62   Ht 5' 7 (1.702 m)   Wt 228 lb 1.9 oz (103.5 kg)   SpO2 99%   BMI 35.73 kg/m  {Vitals History (Optional):23777}  Physical Exam Gen: alert, oriented Heent: perrla, eomi Cv: rrr, no murmur Pulm: lctab Gi: soft, nbs.   MSK: Right knee with fluid collection consistent with prepatellar bursitis. No erythema, warmth, or significant tenderness. GU: Digital rectal exam revealed no external hemorrhoids, fissures, or other abnormalities. No palpable internal abnormalities. Ext: no pedal edema.  Psych: pleasant affect   No results found for any visits on 08/03/24.      Assessment & Plan:   BMI 35.0-35.9,adult Assessment & Plan: Struggling with weight loss despite significant dietary and exercise efforts. Caloric intake is ~1660/day with a healthy, non-processed diet and consistent exercise including walking and weightlifting. Lab work previously showed a normal  thyroid . - Discussed medication options. Wegovy  or Zepbound are preferred. Prior authorization for Wegovy  is in process. Phentermine is a less desirable option due to potential effects on blood pressure. - Counseled on Zepbound as a good alternative to Wegovy , noting potentially fewer side effects and better efficacy at a  similar price point. The starting dose (2.5 mg) can be effective for a period, and the out-of-pocket cost is ~$349/month via a direct pharmacy program. - Ordered labs to check testosterone and cortisol levels to investigate for other potential causes of weight loss resistance. Labs must be drawn before 10 AM. - Discussed continuing current diet and exercise regimen. A nutritionist referral is unlikely to be beneficial given his current healthy eating habits.  Orders: -     Testosterone,Free and Total; Future -     Cortisol; Future  Primary hypertension Assessment & Plan: Blood pressure remains elevated at 146/104 despite medication. Goal BP is <140/90, ideally <130/80. - Will increase valsartan  dose. Current dose is not at maximum. - Instructed to take two of the current strength pills until the new prescription is filled. - Amlodipine combination pill is not covered by insurance.  Orders: -     Basic metabolic panel with GFR; Future  BRBPR (bright red blood per rectum) Assessment & Plan: Presents with two episodes of painless, bright red rectal bleeding. Exam is unremarkable. The most likely causes include internal hemorrhoids or a ruptured blood vessel near the rectum. - Discussed that colon cancer is a warning sign but is not the most common cause. - Offered referral to gastroenterology for colonoscopy, as this is the definitive test and he is nearing the screening age of 25. - Plan to wait and monitor for now. - Will proceed with colonoscopy next year as part of routine screening if bleeding does not recur. - Advised to call back to arrange for colonoscopy sooner if bleeding continues.   Prepatellar bursitis of right knee Assessment & Plan: Presents with acute, non-painful right knee swelling. Exam is consistent with prepatellar bursitis. No signs of infection. - Reassured that the condition is benign and often self-resolves. - Advised on the use of a compression knee sleeve. -  Discussed that drainage by a specialist is an option if it becomes very large, but recurrence is common. - Advised to monitor and return or call if it worsens significantly.   Other orders -     Valsartan ; Take 1 tablet (160 mg total) by mouth daily.  Dispense: 90 tablet; Refill: 3     Return in about 4 weeks (around 08/31/2024) for HTN, testosterone.    Toribio MARLA Slain, MD

## 2024-08-03 NOTE — Assessment & Plan Note (Signed)
 Struggling with weight loss despite significant dietary and exercise efforts. Caloric intake is ~1660/day with a healthy, non-processed diet and consistent exercise including walking and weightlifting. Lab work previously showed a normal thyroid . - Discussed medication options. Wegovy  or Zepbound are preferred. Prior authorization for Wegovy  is in process. Phentermine is a less desirable option due to potential effects on blood pressure. - Counseled on Zepbound as a good alternative to Wegovy , noting potentially fewer side effects and better efficacy at a similar price point. The starting dose (2.5 mg) can be effective for a period, and the out-of-pocket cost is ~$349/month via a direct pharmacy program. - Ordered labs to check testosterone and cortisol levels to investigate for other potential causes of weight loss resistance. Labs must be drawn before 10 AM. - Discussed continuing current diet and exercise regimen. A nutritionist referral is unlikely to be beneficial given his current healthy eating habits.

## 2024-08-03 NOTE — Patient Instructions (Addendum)
 It was nice to see you today,  We addressed the following topics today: -I will check your testosterone level to make sure that is not why you are having trouble losing weight. - If your Wegovy  is not covered I would recommend checking the out-of-pocket price for Wegovy  versus Zepbound.  If they are similar price, the Zepbound is more effective with less side effects. - The swelling on your right knee is something called prepatellar bursitis.  If it starts to look infected let us  know, otherwise you can use compression sleeves on your knee to help with swelling but it should resolve on its own over time. - I did not see or feel anything abnormal on your rectal exam.  If you continue to have bleeding let us  know and I will send a referral to the gastroenterologist, otherwise you are due for a colonoscopy in 1 year anyway so we can do the colonoscopy at that time you turn 44. - I am going to increase your blood pressure medication from 80 to 160 mg for valsartan .  Have a great day,  Rolan Slain, MD

## 2024-08-03 NOTE — Assessment & Plan Note (Signed)
 Presents with acute, non-painful right knee swelling. Exam is consistent with prepatellar bursitis. No signs of infection. - Reassured that the condition is benign and often self-resolves. - Advised on the use of a compression knee sleeve. - Discussed that drainage by a specialist is an option if it becomes very large, but recurrence is common. - Advised to monitor and return or call if it worsens significantly.

## 2024-08-03 NOTE — Assessment & Plan Note (Signed)
 Presents with two episodes of painless, bright red rectal bleeding. Exam is unremarkable. The most likely causes include internal hemorrhoids or a ruptured blood vessel near the rectum. - Discussed that colon cancer is a warning sign but is not the most common cause. - Offered referral to gastroenterology for colonoscopy, as this is the definitive test and he is nearing the screening age of 6. - Plan to wait and monitor for now. - Will proceed with colonoscopy next year as part of routine screening if bleeding does not recur. - Advised to call back to arrange for colonoscopy sooner if bleeding continues.

## 2024-08-09 ENCOUNTER — Other Ambulatory Visit: Payer: Self-pay | Admitting: Family Medicine

## 2024-08-09 MED ORDER — TIRZEPATIDE-WEIGHT MANAGEMENT 2.5 MG/0.5ML ~~LOC~~ SOLN: 2.5000 mg | SUBCUTANEOUS | 1 refills | Status: DC

## 2024-09-22 ENCOUNTER — Encounter: Payer: Self-pay | Admitting: Family Medicine

## 2024-09-22 DIAGNOSIS — Z6835 Body mass index (BMI) 35.0-35.9, adult: Secondary | ICD-10-CM

## 2024-09-23 ENCOUNTER — Other Ambulatory Visit: Payer: Self-pay

## 2024-09-23 MED ORDER — TIRZEPATIDE-WEIGHT MANAGEMENT 5 MG/0.5ML ~~LOC~~ SOLN
5.0000 mg | SUBCUTANEOUS | 1 refills | Status: DC
Start: 2024-09-23 — End: 2024-09-24

## 2024-09-24 ENCOUNTER — Other Ambulatory Visit: Payer: Self-pay

## 2024-09-24 DIAGNOSIS — Z6835 Body mass index (BMI) 35.0-35.9, adult: Secondary | ICD-10-CM

## 2024-11-27 ENCOUNTER — Encounter: Payer: Self-pay | Admitting: Family Medicine

## 2024-11-30 ENCOUNTER — Other Ambulatory Visit: Payer: Self-pay | Admitting: Family Medicine

## 2024-11-30 DIAGNOSIS — Z6835 Body mass index (BMI) 35.0-35.9, adult: Secondary | ICD-10-CM

## 2024-11-30 MED ORDER — ZEPBOUND 5 MG/0.5ML ~~LOC~~ SOLN: 5.0000 mg | SUBCUTANEOUS | 3 refills | Status: AC
